# Patient Record
Sex: Male | Born: 1940
Health system: Southern US, Community
[De-identification: ages and names within clinical notes are randomized; demographics above are authoritative.]

## PROBLEM LIST (undated history)

## (undated) DIAGNOSIS — E119 Type 2 diabetes mellitus without complications: Secondary | ICD-10-CM

## (undated) DIAGNOSIS — I10 Essential (primary) hypertension: Secondary | ICD-10-CM

## (undated) DIAGNOSIS — E7800 Pure hypercholesterolemia, unspecified: Secondary | ICD-10-CM

## (undated) DIAGNOSIS — C61 Malignant neoplasm of prostate: Secondary | ICD-10-CM

## (undated) HISTORY — PX: URINARY SPHINCTER IMPLANT: SHX2624

## (undated) HISTORY — PX: HERNIA REPAIR: SHX51

## (undated) HISTORY — PX: PROSTATECTOMY: SHX69

## (undated) HISTORY — PX: CHOLECYSTECTOMY: SHX55

---

## 2004-01-02 ENCOUNTER — Other Ambulatory Visit: Payer: Self-pay

## 2004-01-08 ENCOUNTER — Inpatient Hospital Stay: Payer: Self-pay | Admitting: Urology

## 2004-03-01 DIAGNOSIS — C61 Malignant neoplasm of prostate: Secondary | ICD-10-CM

## 2004-03-01 HISTORY — DX: Malignant neoplasm of prostate: C61

## 2004-04-21 ENCOUNTER — Ambulatory Visit: Payer: Self-pay | Admitting: Urology

## 2005-04-05 ENCOUNTER — Ambulatory Visit: Payer: Self-pay | Admitting: Ophthalmology

## 2007-09-20 ENCOUNTER — Ambulatory Visit: Payer: Self-pay | Admitting: Unknown Physician Specialty

## 2012-03-10 ENCOUNTER — Ambulatory Visit: Payer: Self-pay | Admitting: Unknown Physician Specialty

## 2014-02-05 DIAGNOSIS — Z8546 Personal history of malignant neoplasm of prostate: Secondary | ICD-10-CM | POA: Insufficient documentation

## 2014-12-02 ENCOUNTER — Other Ambulatory Visit: Payer: Self-pay | Admitting: Internal Medicine

## 2014-12-02 DIAGNOSIS — R9389 Abnormal findings on diagnostic imaging of other specified body structures: Secondary | ICD-10-CM

## 2014-12-04 ENCOUNTER — Ambulatory Visit
Admission: RE | Admit: 2014-12-04 | Discharge: 2014-12-04 | Disposition: A | Discharge status: Home / Self Care | Payer: Commercial Managed Care - HMO | Source: Ambulatory Visit | Attending: Internal Medicine | Admitting: Internal Medicine

## 2014-12-04 ENCOUNTER — Encounter: Payer: Self-pay | Admitting: Radiology

## 2014-12-04 DIAGNOSIS — R938 Abnormal findings on diagnostic imaging of other specified body structures: Secondary | ICD-10-CM | POA: Diagnosis present

## 2014-12-04 DIAGNOSIS — R9389 Abnormal findings on diagnostic imaging of other specified body structures: Secondary | ICD-10-CM

## 2014-12-04 DIAGNOSIS — J189 Pneumonia, unspecified organism: Secondary | ICD-10-CM | POA: Diagnosis not present

## 2014-12-04 DIAGNOSIS — I2510 Atherosclerotic heart disease of native coronary artery without angina pectoris: Secondary | ICD-10-CM | POA: Insufficient documentation

## 2014-12-04 HISTORY — DX: Type 2 diabetes mellitus without complications: E11.9

## 2014-12-04 HISTORY — DX: Pure hypercholesterolemia, unspecified: E78.00

## 2014-12-04 HISTORY — DX: Malignant neoplasm of prostate: C61

## 2014-12-04 MED ORDER — IOHEXOL 300 MG/ML  SOLN
60.0000 mL | Freq: Once | INTRAMUSCULAR | Status: AC | PRN
  Administered 2014-12-04: 60 mL via INTRAVENOUS

## 2015-04-16 DIAGNOSIS — E7800 Pure hypercholesterolemia, unspecified: Secondary | ICD-10-CM | POA: Diagnosis not present

## 2015-04-16 DIAGNOSIS — E119 Type 2 diabetes mellitus without complications: Secondary | ICD-10-CM | POA: Diagnosis not present

## 2015-04-16 DIAGNOSIS — Z0000 Encounter for general adult medical examination without abnormal findings: Secondary | ICD-10-CM | POA: Diagnosis not present

## 2015-04-22 DIAGNOSIS — J189 Pneumonia, unspecified organism: Secondary | ICD-10-CM | POA: Diagnosis not present

## 2015-04-22 DIAGNOSIS — R05 Cough: Secondary | ICD-10-CM | POA: Diagnosis not present

## 2015-04-22 DIAGNOSIS — E119 Type 2 diabetes mellitus without complications: Secondary | ICD-10-CM | POA: Diagnosis not present

## 2015-04-22 DIAGNOSIS — E7800 Pure hypercholesterolemia, unspecified: Secondary | ICD-10-CM | POA: Diagnosis not present

## 2015-10-15 DIAGNOSIS — E119 Type 2 diabetes mellitus without complications: Secondary | ICD-10-CM | POA: Diagnosis not present

## 2015-10-15 DIAGNOSIS — Z125 Encounter for screening for malignant neoplasm of prostate: Secondary | ICD-10-CM | POA: Diagnosis not present

## 2015-10-15 DIAGNOSIS — E7800 Pure hypercholesterolemia, unspecified: Secondary | ICD-10-CM | POA: Diagnosis not present

## 2015-10-22 DIAGNOSIS — Z0000 Encounter for general adult medical examination without abnormal findings: Secondary | ICD-10-CM | POA: Diagnosis not present

## 2015-10-22 DIAGNOSIS — E1121 Type 2 diabetes mellitus with diabetic nephropathy: Secondary | ICD-10-CM | POA: Diagnosis not present

## 2016-01-29 DIAGNOSIS — H353131 Nonexudative age-related macular degeneration, bilateral, early dry stage: Secondary | ICD-10-CM | POA: Diagnosis not present

## 2016-02-03 ENCOUNTER — Other Ambulatory Visit: Payer: Self-pay | Admitting: Internal Medicine

## 2016-02-03 DIAGNOSIS — R0602 Shortness of breath: Secondary | ICD-10-CM

## 2016-02-03 DIAGNOSIS — R079 Chest pain, unspecified: Secondary | ICD-10-CM | POA: Diagnosis not present

## 2016-02-04 ENCOUNTER — Ambulatory Visit
Admission: RE | Admit: 2016-02-04 | Discharge: 2016-02-04 | Disposition: A | Discharge status: Home / Self Care | Payer: PPO | Source: Ambulatory Visit | Attending: Internal Medicine | Admitting: Internal Medicine

## 2016-02-04 DIAGNOSIS — I2510 Atherosclerotic heart disease of native coronary artery without angina pectoris: Secondary | ICD-10-CM | POA: Insufficient documentation

## 2016-02-04 DIAGNOSIS — R0602 Shortness of breath: Secondary | ICD-10-CM | POA: Diagnosis not present

## 2016-02-04 DIAGNOSIS — R079 Chest pain, unspecified: Secondary | ICD-10-CM | POA: Diagnosis not present

## 2016-02-04 DIAGNOSIS — J90 Pleural effusion, not elsewhere classified: Secondary | ICD-10-CM | POA: Diagnosis not present

## 2016-02-04 DIAGNOSIS — I700 Atherosclerosis of aorta: Secondary | ICD-10-CM | POA: Diagnosis not present

## 2016-02-04 HISTORY — DX: Essential (primary) hypertension: I10

## 2016-02-04 LAB — POCT I-STAT CREATININE: CREATININE: 0.9 mg/dL (ref 0.61–1.24)

## 2016-02-04 MED ORDER — IOPAMIDOL (ISOVUE-370) INJECTION 76%
75.0000 mL | Freq: Once | INTRAVENOUS | Status: AC | PRN
  Administered 2016-02-04: 75 mL via INTRAVENOUS

## 2016-02-05 DIAGNOSIS — I5021 Acute systolic (congestive) heart failure: Secondary | ICD-10-CM | POA: Diagnosis not present

## 2016-02-09 ENCOUNTER — Other Ambulatory Visit: Payer: Self-pay | Admitting: Cardiology

## 2016-02-09 DIAGNOSIS — I420 Dilated cardiomyopathy: Secondary | ICD-10-CM | POA: Diagnosis not present

## 2016-02-09 DIAGNOSIS — E7800 Pure hypercholesterolemia, unspecified: Secondary | ICD-10-CM | POA: Diagnosis not present

## 2016-02-09 DIAGNOSIS — I429 Cardiomyopathy, unspecified: Secondary | ICD-10-CM | POA: Diagnosis not present

## 2016-02-09 DIAGNOSIS — E119 Type 2 diabetes mellitus without complications: Secondary | ICD-10-CM | POA: Diagnosis not present

## 2016-02-09 DIAGNOSIS — I509 Heart failure, unspecified: Secondary | ICD-10-CM | POA: Diagnosis not present

## 2016-02-09 MED ORDER — SODIUM CHLORIDE 0.9 % IV SOLN: 250.0000 mL | INTRAVENOUS | Status: AC | PRN

## 2016-02-09 MED ORDER — SODIUM CHLORIDE 0.9% FLUSH: 3.0000 mL | INTRAVENOUS | Status: AC | PRN

## 2016-02-09 MED ORDER — SODIUM CHLORIDE 0.9% FLUSH: 3.0000 mL | Freq: Two times a day (BID) | INTRAVENOUS | Status: AC

## 2016-02-09 MED ORDER — ASPIRIN 81 MG PO CHEW: 81.0000 mg | CHEWABLE_TABLET | ORAL | Status: AC

## 2016-02-09 MED ORDER — SODIUM CHLORIDE 0.9 % IV SOLN: INTRAVENOUS | Status: AC

## 2016-02-10 DIAGNOSIS — I420 Dilated cardiomyopathy: Secondary | ICD-10-CM | POA: Insufficient documentation

## 2016-02-13 ENCOUNTER — Ambulatory Visit
Admission: RE | Admit: 2016-02-13 | Discharge: 2016-02-13 | Disposition: A | Discharge status: Home / Self Care | Payer: PPO | Source: Ambulatory Visit | Attending: Cardiology | Admitting: Cardiology

## 2016-02-13 ENCOUNTER — Encounter
Admission: RE | Disposition: A | Discharge status: Home / Self Care | Payer: Self-pay | Source: Ambulatory Visit | Attending: Cardiology

## 2016-02-13 ENCOUNTER — Encounter: Payer: Self-pay | Admitting: *Deleted

## 2016-02-13 DIAGNOSIS — Z79899 Other long term (current) drug therapy: Secondary | ICD-10-CM | POA: Diagnosis not present

## 2016-02-13 DIAGNOSIS — Z8546 Personal history of malignant neoplasm of prostate: Secondary | ICD-10-CM | POA: Diagnosis not present

## 2016-02-13 DIAGNOSIS — I358 Other nonrheumatic aortic valve disorders: Secondary | ICD-10-CM | POA: Insufficient documentation

## 2016-02-13 DIAGNOSIS — Z7982 Long term (current) use of aspirin: Secondary | ICD-10-CM | POA: Diagnosis not present

## 2016-02-13 DIAGNOSIS — I509 Heart failure, unspecified: Secondary | ICD-10-CM | POA: Insufficient documentation

## 2016-02-13 DIAGNOSIS — I429 Cardiomyopathy, unspecified: Secondary | ICD-10-CM | POA: Diagnosis not present

## 2016-02-13 DIAGNOSIS — Z7984 Long term (current) use of oral hypoglycemic drugs: Secondary | ICD-10-CM | POA: Diagnosis not present

## 2016-02-13 DIAGNOSIS — E119 Type 2 diabetes mellitus without complications: Secondary | ICD-10-CM | POA: Insufficient documentation

## 2016-02-13 DIAGNOSIS — E7800 Pure hypercholesterolemia, unspecified: Secondary | ICD-10-CM | POA: Insufficient documentation

## 2016-02-13 HISTORY — PX: CARDIAC CATHETERIZATION: SHX172

## 2016-02-13 SURGERY — RIGHT/LEFT HEART CATH AND CORONARY ANGIOGRAPHY
Anesthesia: Moderate Sedation | Laterality: Bilateral

## 2016-02-13 MED ORDER — SODIUM CHLORIDE 0.9 % IV SOLN: 250.0000 mL | INTRAVENOUS | Status: DC | PRN

## 2016-02-13 MED ORDER — IOPAMIDOL (ISOVUE-300) INJECTION 61%
INTRAVENOUS | Status: DC | PRN
  Administered 2016-02-13: 85 mL via INTRA_ARTERIAL

## 2016-02-13 MED ORDER — HEPARIN (PORCINE) IN NACL 2-0.9 UNIT/ML-% IJ SOLN
INTRAMUSCULAR | Status: AC
  Filled 2016-02-13: qty 500

## 2016-02-13 MED ORDER — SODIUM CHLORIDE 0.9 % IV SOLN: INTRAVENOUS | Status: DC

## 2016-02-13 MED ORDER — SODIUM CHLORIDE 0.9% FLUSH: 3.0000 mL | Freq: Two times a day (BID) | INTRAVENOUS | Status: DC

## 2016-02-13 MED ORDER — SODIUM CHLORIDE 0.9% FLUSH: 3.0000 mL | INTRAVENOUS | Status: DC | PRN

## 2016-02-13 MED ORDER — MIDAZOLAM HCL 2 MG/2ML IJ SOLN
INTRAMUSCULAR | Status: AC
  Filled 2016-02-13: qty 2

## 2016-02-13 MED ORDER — FENTANYL CITRATE (PF) 100 MCG/2ML IJ SOLN
INTRAMUSCULAR | Status: AC
  Filled 2016-02-13: qty 2

## 2016-02-13 MED ORDER — ASPIRIN 81 MG PO CHEW: 81.0000 mg | CHEWABLE_TABLET | ORAL | Status: DC

## 2016-02-13 MED ORDER — MIDAZOLAM HCL 2 MG/2ML IJ SOLN
INTRAMUSCULAR | Status: DC | PRN
  Administered 2016-02-13 (×2): 0.5 mg

## 2016-02-13 MED ORDER — FENTANYL CITRATE (PF) 100 MCG/2ML IJ SOLN
INTRAMUSCULAR | Status: DC | PRN
  Administered 2016-02-13 (×2): 25 ug

## 2016-02-13 SURGICAL SUPPLY — 16 items
CATH INFINITI 5 FR JL3.5 (CATHETERS) ×3 IMPLANT
CATH INFINITI 5FR ANG PIGTAIL (CATHETERS) ×3 IMPLANT
CATH INFINITI 5FR JL4 (CATHETERS) ×3 IMPLANT
CATH INFINITI JR4 5F (CATHETERS) ×3 IMPLANT
CATH SWANZ 7F THERMO (CATHETERS) ×3 IMPLANT
DEVICE CLOSURE MYNXGRIP 6/7F (Vascular Products) ×3 IMPLANT
GUIDEWIRE EMER 3M J .025X150CM (WIRE) ×3 IMPLANT
KIT MANI 3VAL PERCEP (MISCELLANEOUS) ×3 IMPLANT
KIT RIGHT HEART (MISCELLANEOUS) ×3 IMPLANT
NEEDLE PERC 18GX7CM (NEEDLE) ×3 IMPLANT
PACK CARDIAC CATH (CUSTOM PROCEDURE TRAY) ×3 IMPLANT
SHEATH AVANTI 5FR X 11CM (SHEATH) ×3 IMPLANT
SHEATH AVANTI 6FR X 11CM (SHEATH) ×3 IMPLANT
SHEATH PINNACLE 7F 10CM (SHEATH) ×3 IMPLANT
WIRE EMERALD 3MM-J .035X150CM (WIRE) ×3 IMPLANT
WIRE EMERALD ST .035X150CM (WIRE) ×3 IMPLANT

## 2016-02-13 NOTE — H&P (Signed)
Chief Complaint: Chief Complaint  Patient presents with  . per miller  sob and cough----8-9 weeks progressively getting worse--had CT showed fluid on lungs so had echo which sent him here  Date of Service: 02/09/2016 Date of Birth: April 14, 1940 PCP: Rusty Aus, MD  History of Present Illness: William Snyder is a 75 y.o.male patient who is urgently referred by Dr. Rusty Aus for evaluation after echocardiogram was read as showing cardiomyopathy with an ejection fraction of 15%. Patient has a history of approximately 2-3 months of progressive dyspnea. Underwent a chest CT which revealed evidence of probable pulmonary edema with no pulmonary emboli. There is a mild interstitial edema pattern. Patient underwent an echocardiogram which was read as showing ejection fraction of less than 15% with global hypokinesis. There is a sclerotic aortic valve. Patient is now referred for further evaluation. He complains of dyspnea on exertion. He denies chest discomfort. He denies a viral illness. He denies ethanol abuse. He does not smoke. He does have diabetes mellitus. He was given furosemide last week. He takes 20 mg daily. He admits to a high sodium diet. Was on lisinopril but had a dry cough. He is currently on losartan at 50 mg daily. He treated with simvastatin for hyperlipidemia. He is on metformin for diabetes mellitus.  Past Medical and Surgical History  Past Medical History Past Medical History:  Diagnosis Date  . Prostate cancer (CMS-HCC)  . Pure hypercholesterolemia  . Type II or unspecified type diabetes mellitus without mention of complication, not stated as uncontrolled (CMS-HCC)   Past Surgical History He has a past surgical history that includes Cholecystectomy; Sphincter implant (2007); Hernia repair; Vasectomy; Prostate surgery; Surgery for urethral stricture; Cataract extraction (Bilateral); egd (07/27/1996); Colonoscopy (04/13/1995); Colonoscopy (09/20/2007); and Colonoscopy (03/10/2012).    Medications and Allergies  Current Medications  Current Outpatient Prescriptions  Medication Sig Dispense Refill  . alcohol swabs (BD ALCOHOL SWABS) PadM Apply 1 Swab topically 2 (two) times daily. 180 each 3  . aspirin 81 MG EC tablet Take 81 mg by mouth once daily.  . blood glucose control high,low (ACCU-CHEK AVIVA CONTROL SOLN) Soln Use 1 Bottle as directed. 1 each 3  . blood glucose diagnostic test strip Use 2 (two) times daily. One touch ultra test strips. Diagnosis: E11.9 100 each 11  . blood glucose meter kit Use as directed. One touch glucometer. Diagnosis: E11.9 1 each 0  . FUROsemide (LASIX) 20 MG tablet Take 1 tablet (20 mg total) by mouth once daily. 30 tablet 1  . glimepiride (AMARYL) 4 MG tablet Take 1 tablet (4 mg total) by mouth daily with breakfast. 90 tablet 3  . lancets Use 1 each 2 (two) times daily. One touch lancets. Diagnosis: E11.9 100 each 12  . losartan (COZAAR) 50 MG tablet Take 1 tablet (50 mg total) by mouth once daily. 90 tablet 3  . metFORMIN (GLUCOPHAGE) 1000 MG tablet Take 2.5 tablets (2,500 mg total) by mouth once daily. 1 q.a.m. 1-1/2 q.p.m. 225 tablet 3  . potassium chloride (KLOR-CON) 10 MEQ ER tablet Take 1 tablet (10 mEq total) by mouth once daily. 30 tablet 1  . simvastatin (ZOCOR) 40 MG tablet Take 1 tablet (40 mg total) by mouth nightly. 90 tablet 3   No current facility-administered medications for this visit.   Allergies: Patient has no known allergies.  Social and Family History  Social History reports that he has never smoked. He has never used smokeless tobacco. He reports that he does not  drink alcohol.  Family History Family History  Problem Relation Age of Onset  . Heart failure Mother  . Stroke Mother  . Stroke Father  . Myocardial Infarction (Heart attack) Father  . Myocardial Infarction (Heart attack) Brother   Review of Systems  Review of Systems  Constitutional: Positive for malaise/fatigue and weight loss. Negative for  chills, diaphoresis and fever.  HENT: Negative. Negative for congestion, ear discharge, hearing loss and tinnitus.  Eyes: Negative. Negative for blurred vision.  Respiratory: Positive for cough and shortness of breath. Negative for hemoptysis, sputum production and wheezing.  Cardiovascular: Negative. Negative for chest pain, palpitations, orthopnea, claudication, leg swelling and PND.  Gastrointestinal: Negative. Negative for abdominal pain, blood in stool, constipation, diarrhea, heartburn, melena, nausea and vomiting.  Genitourinary: Negative. Negative for dysuria, frequency, hematuria and urgency.  Musculoskeletal: Negative. Negative for back pain, falls, joint pain and myalgias.  Skin: Negative for itching and rash.  Neurological: Positive for weakness. Negative for dizziness, tingling, focal weakness, loss of consciousness and headaches.  Endo/Heme/Allergies: Negative. Negative for polydipsia. Does not bruise/bleed easily.  Psychiatric/Behavioral: Negative. Negative for depression, memory loss and substance abuse. The patient is not nervous/anxious.   Physical Examination   Vitals:BP 120/84  Pulse 88  Resp 12  Ht 177.8 cm (5' 10" )  Wt 77.1 kg (170 lb)  BMI 24.39 kg/m  Ht:177.8 cm (5' 10" ) Wt:77.1 kg (170 lb) FBX:UXYB surface area is 1.95 meters squared. Body mass index is 24.39 kg/m.  Wt Readings from Last 3 Encounters:  02/09/16 77.1 kg (170 lb)  02/03/16 80.3 kg (177 lb)  10/22/15 80.7 kg (178 lb)   BP Readings from Last 3 Encounters:  02/09/16 120/84  02/03/16 104/60  10/22/15 142/70   General appearance appears in no acute distress  Head Mouth and Eye exam Normocephalic, without obvious abnormality, atraumatic Dentition is good Eyes appear anicteric   Neck exam Thyroid: normal  Nodes: no obvious adenopathy  LUNGS Breath Sounds: Normal Percussion: Normal  CARDIOVASCULAR JVP CV wave: no HJR: no Elevation at 90 degrees: None Carotid Pulse: normal  pulsation bilaterally Bruit: None Apex: apical impulse normal  Auscultation Rhythm: normal sinus rhythm S1: normal S2: normal Clicks: no Rub: no Murmurs: no murmurs  Gallop: None ABDOMEN Liver enlargement: no Pulsatile aorta: no Ascites: no Bruits: no  EXTREMITIES Clubbing: no Edema: 1+ bilateral pedal edema Pulses: peripheral pulses symmetrical Femoral Bruits: no Amputation: no SKIN Rash: no Cyanosis: no Embolic phemonenon: no Bruising: no NEURO Alert and Oriented to person, place and time: yes Non focal: yes  PSYCH: Pt appears to have normal affect  LABS REVIEWED Last 3 CBC results: Lab Results  Component Value Date  WBC 11.6 (H) 02/09/2016  WBC 8.1 10/15/2015  WBC 8.1 10/11/2014   Lab Results  Component Value Date  HGB 14.9 02/09/2016  HGB 14.0 (L) 10/15/2015  HGB 14.0 (L) 04/16/2015   Lab Results  Component Value Date  HCT 43.9 02/09/2016  HCT 42.2 10/15/2015  HCT 43.9 10/11/2014   Lab Results  Component Value Date  PLT 296 02/09/2016  PLT 240 10/15/2015  PLT 238 10/11/2014   Lab Results  Component Value Date  CREATININE 1.3 02/09/2016  BUN 23 02/09/2016  NA 134 (L) 02/09/2016  K 5.0 02/09/2016  CL 96 (L) 02/09/2016  CO2 28.0 02/09/2016   Lab Results  Component Value Date  HGBA1C 9.7 (H) 10/15/2015   Lab Results  Component Value Date  HDL 52.7 10/15/2015  HDL 46.2 04/16/2015  HDL 46.0 10/11/2014  Lab Results  Component Value Date  LDLCALC 155 (H) 10/15/2015  LDLCALC 77 04/16/2015  LDLCALC 74 10/11/2014   Lab Results  Component Value Date  TRIG 123 10/15/2015  TRIG 136 04/16/2015  TRIG 115 10/11/2014   Lab Results  Component Value Date  ALT 14 10/15/2015  AST 16 10/15/2015  ALKPHOS 60 10/15/2015   Lab Results  Component Value Date  TSH 4.180 10/15/2015   Diagnostic Studies Reviewed:  EKG EKG demonstrated normal sinus rhythm, nonspecific ST and T waves changes.  Assessment and Plan   75 y.o. male with   ICD-10-CM ICD-9-CM  1. CHF with cardiomyopathy (CMS-HCC)-etiology of cardiomyopathy is unclear. Will need to evaluate for evidence of ischemic etiology. We will proceed with a right left heart cath to evaluate pulmonary pressures as well as her evidence of coronary etiology of his symptoms to guide further therapy. Will continue with afterload reduction, diuretic. Defer beta-blocker at present due to relative hypotension however will need to consider adding carvedilol at 3.125 mg twice daily. Further recommendations after cardiac catheterization. Low-sodium diet is strongly recommended. I50.9 425.5 Basic Metabolic Panel (BMP)  K58.9 425.4 CBC w/auto Differential (5 Part)  Basic Metabolic Panel (BMP)  CBC w/auto Differential (5 Part)  2. Pure hypercholesterolemia continue with simvastatin with an LDL goal of less than 100. Current value is 155. Low-fat diet is strongly recommended E78.00 272.0  3. Cardiomyopathy, dilated (CMS-HCC) if and as per above I42.0 425.4  4. Controlled type 2 diabetes mellitus and continue with metformin. A1c goal of less than 6 E11.9 250.00   Return in about 2 weeks (around 02/23/2016).  These notes generated with voice recognition software. I apologize for typographical errors.  Sydnee Levans, MD    H and P reviewed. No changes.

## 2016-02-16 ENCOUNTER — Other Ambulatory Visit: Payer: Self-pay | Admitting: Cardiology

## 2016-02-17 DIAGNOSIS — I420 Dilated cardiomyopathy: Secondary | ICD-10-CM | POA: Diagnosis not present

## 2016-02-17 DIAGNOSIS — E119 Type 2 diabetes mellitus without complications: Secondary | ICD-10-CM | POA: Diagnosis not present

## 2016-02-18 DIAGNOSIS — I429 Cardiomyopathy, unspecified: Secondary | ICD-10-CM | POA: Diagnosis not present

## 2016-02-19 ENCOUNTER — Telehealth (HOSPITAL_COMMUNITY): Payer: Self-pay | Admitting: Internal Medicine

## 2016-02-19 DIAGNOSIS — I420 Dilated cardiomyopathy: Secondary | ICD-10-CM | POA: Diagnosis not present

## 2016-02-19 DIAGNOSIS — I509 Heart failure, unspecified: Secondary | ICD-10-CM | POA: Diagnosis not present

## 2016-02-19 NOTE — Telephone Encounter (Signed)
Dr. Ubaldo Glassing sent Referral for this pt to see Dr. Haroldine Laws.  Called to get pt scheduled & pt's wife, Baker Janus, stated pt was seen 02/18/16 @[facility] by Dr. Leontine Locket for Adv Heart Failure.  Declined appt here.

## 2016-03-03 DIAGNOSIS — E119 Type 2 diabetes mellitus without complications: Secondary | ICD-10-CM | POA: Diagnosis not present

## 2016-03-03 DIAGNOSIS — I420 Dilated cardiomyopathy: Secondary | ICD-10-CM | POA: Diagnosis not present

## 2016-03-21 DIAGNOSIS — I509 Heart failure, unspecified: Secondary | ICD-10-CM | POA: Diagnosis not present

## 2016-03-21 DIAGNOSIS — I420 Dilated cardiomyopathy: Secondary | ICD-10-CM | POA: Diagnosis not present

## 2016-03-24 DIAGNOSIS — I429 Cardiomyopathy, unspecified: Secondary | ICD-10-CM | POA: Diagnosis not present

## 2016-04-08 DIAGNOSIS — I5020 Unspecified systolic (congestive) heart failure: Secondary | ICD-10-CM | POA: Diagnosis not present

## 2016-04-08 DIAGNOSIS — R9439 Abnormal result of other cardiovascular function study: Secondary | ICD-10-CM | POA: Diagnosis not present

## 2016-04-08 DIAGNOSIS — I429 Cardiomyopathy, unspecified: Secondary | ICD-10-CM | POA: Diagnosis not present

## 2016-04-16 DIAGNOSIS — E119 Type 2 diabetes mellitus without complications: Secondary | ICD-10-CM | POA: Diagnosis not present

## 2016-04-16 DIAGNOSIS — E7800 Pure hypercholesterolemia, unspecified: Secondary | ICD-10-CM | POA: Diagnosis not present

## 2016-04-16 DIAGNOSIS — Z0000 Encounter for general adult medical examination without abnormal findings: Secondary | ICD-10-CM | POA: Diagnosis not present

## 2016-04-21 DIAGNOSIS — Z7409 Other reduced mobility: Secondary | ICD-10-CM | POA: Diagnosis not present

## 2016-04-21 DIAGNOSIS — E785 Hyperlipidemia, unspecified: Secondary | ICD-10-CM | POA: Diagnosis not present

## 2016-04-21 DIAGNOSIS — Z8701 Personal history of pneumonia (recurrent): Secondary | ICD-10-CM | POA: Diagnosis not present

## 2016-04-21 DIAGNOSIS — R5381 Other malaise: Secondary | ICD-10-CM | POA: Diagnosis not present

## 2016-04-21 DIAGNOSIS — Z6822 Body mass index (BMI) 22.0-22.9, adult: Secondary | ICD-10-CM | POA: Diagnosis not present

## 2016-04-21 DIAGNOSIS — I420 Dilated cardiomyopathy: Secondary | ICD-10-CM | POA: Diagnosis not present

## 2016-04-21 DIAGNOSIS — Z79899 Other long term (current) drug therapy: Secondary | ICD-10-CM | POA: Diagnosis not present

## 2016-04-21 DIAGNOSIS — Z7984 Long term (current) use of oral hypoglycemic drugs: Secondary | ICD-10-CM | POA: Diagnosis not present

## 2016-04-21 DIAGNOSIS — Z9049 Acquired absence of other specified parts of digestive tract: Secondary | ICD-10-CM | POA: Diagnosis not present

## 2016-04-21 DIAGNOSIS — M6259 Muscle wasting and atrophy, not elsewhere classified, multiple sites: Secondary | ICD-10-CM | POA: Diagnosis not present

## 2016-04-21 DIAGNOSIS — Z9689 Presence of other specified functional implants: Secondary | ICD-10-CM | POA: Diagnosis not present

## 2016-04-21 DIAGNOSIS — Z8249 Family history of ischemic heart disease and other diseases of the circulatory system: Secondary | ICD-10-CM | POA: Diagnosis not present

## 2016-04-21 DIAGNOSIS — I2510 Atherosclerotic heart disease of native coronary artery without angina pectoris: Secondary | ICD-10-CM | POA: Diagnosis not present

## 2016-04-21 DIAGNOSIS — E1122 Type 2 diabetes mellitus with diabetic chronic kidney disease: Secondary | ICD-10-CM | POA: Diagnosis not present

## 2016-04-21 DIAGNOSIS — I509 Heart failure, unspecified: Secondary | ICD-10-CM | POA: Diagnosis not present

## 2016-04-21 DIAGNOSIS — N182 Chronic kidney disease, stage 2 (mild): Secondary | ICD-10-CM | POA: Diagnosis not present

## 2016-04-21 DIAGNOSIS — I5022 Chronic systolic (congestive) heart failure: Secondary | ICD-10-CM | POA: Diagnosis not present

## 2016-04-21 DIAGNOSIS — I083 Combined rheumatic disorders of mitral, aortic and tricuspid valves: Secondary | ICD-10-CM | POA: Diagnosis not present

## 2016-04-21 DIAGNOSIS — Z8546 Personal history of malignant neoplasm of prostate: Secondary | ICD-10-CM | POA: Diagnosis not present

## 2016-04-21 DIAGNOSIS — E440 Moderate protein-calorie malnutrition: Secondary | ICD-10-CM | POA: Diagnosis not present

## 2016-04-21 DIAGNOSIS — R110 Nausea: Secondary | ICD-10-CM | POA: Diagnosis not present

## 2016-04-21 DIAGNOSIS — Z7982 Long term (current) use of aspirin: Secondary | ICD-10-CM | POA: Diagnosis not present

## 2016-04-21 DIAGNOSIS — I130 Hypertensive heart and chronic kidney disease with heart failure and stage 1 through stage 4 chronic kidney disease, or unspecified chronic kidney disease: Secondary | ICD-10-CM | POA: Diagnosis not present

## 2016-04-23 DIAGNOSIS — E119 Type 2 diabetes mellitus without complications: Secondary | ICD-10-CM | POA: Diagnosis not present

## 2016-04-23 DIAGNOSIS — Z0000 Encounter for general adult medical examination without abnormal findings: Secondary | ICD-10-CM | POA: Diagnosis not present

## 2016-04-23 DIAGNOSIS — I420 Dilated cardiomyopathy: Secondary | ICD-10-CM | POA: Diagnosis not present

## 2016-05-06 DIAGNOSIS — Q251 Coarctation of aorta: Secondary | ICD-10-CM | POA: Diagnosis not present

## 2016-05-06 DIAGNOSIS — I361 Nonrheumatic tricuspid (valve) insufficiency: Secondary | ICD-10-CM | POA: Diagnosis not present

## 2016-05-06 DIAGNOSIS — I340 Nonrheumatic mitral (valve) insufficiency: Secondary | ICD-10-CM | POA: Diagnosis not present

## 2016-05-06 DIAGNOSIS — I517 Cardiomegaly: Secondary | ICD-10-CM | POA: Diagnosis not present

## 2016-05-06 DIAGNOSIS — I083 Combined rheumatic disorders of mitral, aortic and tricuspid valves: Secondary | ICD-10-CM | POA: Diagnosis not present

## 2016-05-06 DIAGNOSIS — I700 Atherosclerosis of aorta: Secondary | ICD-10-CM | POA: Diagnosis not present

## 2016-05-19 DIAGNOSIS — I420 Dilated cardiomyopathy: Secondary | ICD-10-CM | POA: Diagnosis not present

## 2016-05-19 DIAGNOSIS — I509 Heart failure, unspecified: Secondary | ICD-10-CM | POA: Diagnosis not present

## 2016-05-21 NOTE — Telephone Encounter (Signed)
error 

## 2016-06-19 DIAGNOSIS — I420 Dilated cardiomyopathy: Secondary | ICD-10-CM | POA: Diagnosis not present

## 2016-06-19 DIAGNOSIS — I509 Heart failure, unspecified: Secondary | ICD-10-CM | POA: Diagnosis not present

## 2016-06-30 DIAGNOSIS — I420 Dilated cardiomyopathy: Secondary | ICD-10-CM | POA: Diagnosis not present

## 2016-06-30 DIAGNOSIS — I519 Heart disease, unspecified: Secondary | ICD-10-CM | POA: Diagnosis not present

## 2016-06-30 DIAGNOSIS — R001 Bradycardia, unspecified: Secondary | ICD-10-CM | POA: Diagnosis not present

## 2016-06-30 DIAGNOSIS — I5022 Chronic systolic (congestive) heart failure: Secondary | ICD-10-CM | POA: Diagnosis not present

## 2016-07-07 DIAGNOSIS — I5022 Chronic systolic (congestive) heart failure: Secondary | ICD-10-CM | POA: Diagnosis not present

## 2016-07-07 DIAGNOSIS — I509 Heart failure, unspecified: Secondary | ICD-10-CM | POA: Insufficient documentation

## 2016-07-16 DIAGNOSIS — E119 Type 2 diabetes mellitus without complications: Secondary | ICD-10-CM | POA: Diagnosis not present

## 2016-07-19 DIAGNOSIS — I509 Heart failure, unspecified: Secondary | ICD-10-CM | POA: Diagnosis not present

## 2016-07-19 DIAGNOSIS — I420 Dilated cardiomyopathy: Secondary | ICD-10-CM | POA: Diagnosis not present

## 2016-07-22 DIAGNOSIS — E1121 Type 2 diabetes mellitus with diabetic nephropathy: Secondary | ICD-10-CM | POA: Diagnosis not present

## 2016-07-22 DIAGNOSIS — Z125 Encounter for screening for malignant neoplasm of prostate: Secondary | ICD-10-CM | POA: Diagnosis not present

## 2016-07-22 DIAGNOSIS — Z79899 Other long term (current) drug therapy: Secondary | ICD-10-CM | POA: Diagnosis not present

## 2016-07-22 DIAGNOSIS — I420 Dilated cardiomyopathy: Secondary | ICD-10-CM | POA: Diagnosis not present

## 2016-07-22 DIAGNOSIS — E119 Type 2 diabetes mellitus without complications: Secondary | ICD-10-CM | POA: Diagnosis not present

## 2016-07-30 DIAGNOSIS — I429 Cardiomyopathy, unspecified: Secondary | ICD-10-CM | POA: Diagnosis not present

## 2016-07-30 DIAGNOSIS — I509 Heart failure, unspecified: Secondary | ICD-10-CM | POA: Diagnosis not present

## 2016-07-30 DIAGNOSIS — I130 Hypertensive heart and chronic kidney disease with heart failure and stage 1 through stage 4 chronic kidney disease, or unspecified chronic kidney disease: Secondary | ICD-10-CM | POA: Diagnosis not present

## 2016-07-30 DIAGNOSIS — Z7984 Long term (current) use of oral hypoglycemic drugs: Secondary | ICD-10-CM | POA: Diagnosis not present

## 2016-07-30 DIAGNOSIS — I493 Ventricular premature depolarization: Secondary | ICD-10-CM | POA: Diagnosis not present

## 2016-07-30 DIAGNOSIS — Z8546 Personal history of malignant neoplasm of prostate: Secondary | ICD-10-CM | POA: Diagnosis not present

## 2016-07-30 DIAGNOSIS — N182 Chronic kidney disease, stage 2 (mild): Secondary | ICD-10-CM | POA: Diagnosis not present

## 2016-07-30 DIAGNOSIS — E785 Hyperlipidemia, unspecified: Secondary | ICD-10-CM | POA: Diagnosis not present

## 2016-07-30 DIAGNOSIS — Z950 Presence of cardiac pacemaker: Secondary | ICD-10-CM | POA: Diagnosis not present

## 2016-07-30 DIAGNOSIS — Z7982 Long term (current) use of aspirin: Secondary | ICD-10-CM | POA: Diagnosis not present

## 2016-07-30 DIAGNOSIS — E1122 Type 2 diabetes mellitus with diabetic chronic kidney disease: Secondary | ICD-10-CM | POA: Diagnosis not present

## 2016-07-30 DIAGNOSIS — Z79899 Other long term (current) drug therapy: Secondary | ICD-10-CM | POA: Diagnosis not present

## 2016-07-30 DIAGNOSIS — Z9049 Acquired absence of other specified parts of digestive tract: Secondary | ICD-10-CM | POA: Diagnosis not present

## 2016-07-30 DIAGNOSIS — Z95810 Presence of automatic (implantable) cardiac defibrillator: Secondary | ICD-10-CM | POA: Diagnosis not present

## 2016-07-30 DIAGNOSIS — E860 Dehydration: Secondary | ICD-10-CM | POA: Diagnosis not present

## 2016-07-30 DIAGNOSIS — I5022 Chronic systolic (congestive) heart failure: Secondary | ICD-10-CM | POA: Diagnosis not present

## 2016-07-30 DIAGNOSIS — I2510 Atherosclerotic heart disease of native coronary artery without angina pectoris: Secondary | ICD-10-CM | POA: Diagnosis not present

## 2016-08-04 DIAGNOSIS — Z95810 Presence of automatic (implantable) cardiac defibrillator: Secondary | ICD-10-CM | POA: Diagnosis not present

## 2016-08-04 DIAGNOSIS — Z4502 Encounter for adjustment and management of automatic implantable cardiac defibrillator: Secondary | ICD-10-CM | POA: Diagnosis not present

## 2016-08-04 DIAGNOSIS — Z8249 Family history of ischemic heart disease and other diseases of the circulatory system: Secondary | ICD-10-CM | POA: Diagnosis not present

## 2016-08-04 DIAGNOSIS — Z7984 Long term (current) use of oral hypoglycemic drugs: Secondary | ICD-10-CM | POA: Diagnosis not present

## 2016-08-04 DIAGNOSIS — E785 Hyperlipidemia, unspecified: Secondary | ICD-10-CM | POA: Diagnosis not present

## 2016-08-04 DIAGNOSIS — Z7982 Long term (current) use of aspirin: Secondary | ICD-10-CM | POA: Diagnosis not present

## 2016-08-04 DIAGNOSIS — Z79899 Other long term (current) drug therapy: Secondary | ICD-10-CM | POA: Diagnosis not present

## 2016-08-04 DIAGNOSIS — E1122 Type 2 diabetes mellitus with diabetic chronic kidney disease: Secondary | ICD-10-CM | POA: Diagnosis not present

## 2016-08-04 DIAGNOSIS — Z9049 Acquired absence of other specified parts of digestive tract: Secondary | ICD-10-CM | POA: Diagnosis not present

## 2016-08-04 DIAGNOSIS — E119 Type 2 diabetes mellitus without complications: Secondary | ICD-10-CM | POA: Diagnosis not present

## 2016-08-04 DIAGNOSIS — I5022 Chronic systolic (congestive) heart failure: Secondary | ICD-10-CM | POA: Diagnosis not present

## 2016-08-04 DIAGNOSIS — I2510 Atherosclerotic heart disease of native coronary artery without angina pectoris: Secondary | ICD-10-CM | POA: Diagnosis not present

## 2016-08-04 DIAGNOSIS — I130 Hypertensive heart and chronic kidney disease with heart failure and stage 1 through stage 4 chronic kidney disease, or unspecified chronic kidney disease: Secondary | ICD-10-CM | POA: Diagnosis not present

## 2016-08-04 DIAGNOSIS — E1165 Type 2 diabetes mellitus with hyperglycemia: Secondary | ICD-10-CM | POA: Diagnosis not present

## 2016-08-04 DIAGNOSIS — N182 Chronic kidney disease, stage 2 (mild): Secondary | ICD-10-CM | POA: Diagnosis not present

## 2016-08-04 DIAGNOSIS — I129 Hypertensive chronic kidney disease with stage 1 through stage 4 chronic kidney disease, or unspecified chronic kidney disease: Secondary | ICD-10-CM | POA: Diagnosis not present

## 2016-08-04 DIAGNOSIS — Z8546 Personal history of malignant neoplasm of prostate: Secondary | ICD-10-CM | POA: Diagnosis not present

## 2016-08-06 DIAGNOSIS — Z79899 Other long term (current) drug therapy: Secondary | ICD-10-CM | POA: Diagnosis not present

## 2016-08-06 DIAGNOSIS — Z7984 Long term (current) use of oral hypoglycemic drugs: Secondary | ICD-10-CM | POA: Diagnosis not present

## 2016-08-06 DIAGNOSIS — C61 Malignant neoplasm of prostate: Secondary | ICD-10-CM | POA: Diagnosis not present

## 2016-08-06 DIAGNOSIS — E1122 Type 2 diabetes mellitus with diabetic chronic kidney disease: Secondary | ICD-10-CM | POA: Diagnosis not present

## 2016-08-06 DIAGNOSIS — I5022 Chronic systolic (congestive) heart failure: Secondary | ICD-10-CM | POA: Diagnosis not present

## 2016-08-06 DIAGNOSIS — N182 Chronic kidney disease, stage 2 (mild): Secondary | ICD-10-CM | POA: Diagnosis not present

## 2016-08-06 DIAGNOSIS — E785 Hyperlipidemia, unspecified: Secondary | ICD-10-CM | POA: Diagnosis not present

## 2016-08-06 DIAGNOSIS — Z7982 Long term (current) use of aspirin: Secondary | ICD-10-CM | POA: Diagnosis not present

## 2016-08-06 DIAGNOSIS — I2510 Atherosclerotic heart disease of native coronary artery without angina pectoris: Secondary | ICD-10-CM | POA: Diagnosis not present

## 2016-08-06 DIAGNOSIS — I129 Hypertensive chronic kidney disease with stage 1 through stage 4 chronic kidney disease, or unspecified chronic kidney disease: Secondary | ICD-10-CM | POA: Diagnosis not present

## 2016-08-27 DIAGNOSIS — Z7982 Long term (current) use of aspirin: Secondary | ICD-10-CM | POA: Diagnosis not present

## 2016-08-27 DIAGNOSIS — I5022 Chronic systolic (congestive) heart failure: Secondary | ICD-10-CM | POA: Diagnosis not present

## 2016-08-27 DIAGNOSIS — E785 Hyperlipidemia, unspecified: Secondary | ICD-10-CM | POA: Diagnosis not present

## 2016-08-27 DIAGNOSIS — N182 Chronic kidney disease, stage 2 (mild): Secondary | ICD-10-CM | POA: Diagnosis not present

## 2016-08-27 DIAGNOSIS — I130 Hypertensive heart and chronic kidney disease with heart failure and stage 1 through stage 4 chronic kidney disease, or unspecified chronic kidney disease: Secondary | ICD-10-CM | POA: Diagnosis not present

## 2016-08-27 DIAGNOSIS — C61 Malignant neoplasm of prostate: Secondary | ICD-10-CM | POA: Diagnosis not present

## 2016-08-27 DIAGNOSIS — Z7984 Long term (current) use of oral hypoglycemic drugs: Secondary | ICD-10-CM | POA: Diagnosis not present

## 2016-08-27 DIAGNOSIS — I2510 Atherosclerotic heart disease of native coronary artery without angina pectoris: Secondary | ICD-10-CM | POA: Diagnosis not present

## 2016-08-27 DIAGNOSIS — E1122 Type 2 diabetes mellitus with diabetic chronic kidney disease: Secondary | ICD-10-CM | POA: Diagnosis not present

## 2016-09-02 DIAGNOSIS — I5022 Chronic systolic (congestive) heart failure: Secondary | ICD-10-CM | POA: Diagnosis not present

## 2016-09-30 DIAGNOSIS — I5022 Chronic systolic (congestive) heart failure: Secondary | ICD-10-CM | POA: Diagnosis not present

## 2016-10-15 DIAGNOSIS — I5022 Chronic systolic (congestive) heart failure: Secondary | ICD-10-CM | POA: Diagnosis not present

## 2016-10-15 DIAGNOSIS — I509 Heart failure, unspecified: Secondary | ICD-10-CM | POA: Diagnosis not present

## 2016-11-04 DIAGNOSIS — I5022 Chronic systolic (congestive) heart failure: Secondary | ICD-10-CM | POA: Diagnosis not present

## 2016-11-04 DIAGNOSIS — Z4502 Encounter for adjustment and management of automatic implantable cardiac defibrillator: Secondary | ICD-10-CM | POA: Diagnosis not present

## 2016-11-18 ENCOUNTER — Encounter: Payer: Self-pay | Admitting: *Deleted

## 2016-11-18 ENCOUNTER — Encounter: Payer: PPO | Attending: Internal Medicine | Admitting: *Deleted

## 2016-11-18 VITALS — Ht 70.0 in | Wt 164.5 lb

## 2016-11-18 DIAGNOSIS — I429 Cardiomyopathy, unspecified: Secondary | ICD-10-CM | POA: Diagnosis not present

## 2016-11-18 DIAGNOSIS — I5022 Chronic systolic (congestive) heart failure: Secondary | ICD-10-CM | POA: Insufficient documentation

## 2016-11-18 DIAGNOSIS — E7800 Pure hypercholesterolemia, unspecified: Secondary | ICD-10-CM | POA: Insufficient documentation

## 2016-11-18 NOTE — Patient Instructions (Signed)
Patient Instructions  Patient Details  Name: William Snyder MRN: [id number] Date of Birth: [birth year] Referring Provider:  Rusty Aus, MD  Below are the personal goals you chose as well as exercise and nutrition goals. Our goal is to help you keep on track towards obtaining and maintaining your goals. We will be discussing your progress on these goals with you throughout the program.  Initial Exercise Prescription:     Initial Exercise Prescription - 11/18/16 1400      Date of Initial Exercise RX and Referring Provider   Date 11/18/16   Referring Provider Sabra Heck     Treadmill   MPH 2.7   Grade 2.5   Minutes 15   METs 4     Recumbant Bike   Level 5   RPM 60   Watts 45   Minutes 15   METs 3.9     NuStep   Level 4   SPM 80   Minutes 15   METs 4     REL-XR   Level 3   Speed 50   Minutes 15   METs 4     Prescription Details   Frequency (times per week) 3   Duration Progress to 45 minutes of aerobic exercise without signs/symptoms of physical distress     Intensity   THRR 40-80% of Max Heartrate 96-128   Ratings of Perceived Exertion 11-13   Perceived Dyspnea 0-4     Resistance Training   Training Prescription Yes   Weight 3   Reps 10-15      Exercise Goals: Frequency: Be able to perform aerobic exercise three times per week working toward 3-5 days per week.  Intensity: Work with a perceived exertion of 11 (fairly light) - 15 (hard) as tolerated. Follow your new exercise prescription and watch for changes in prescription as you progress with the program. Changes will be reviewed with you when they are made.  Duration: You should be able to do 30 minutes of continuous aerobic exercise in addition to a 5 minute warm-up and a 5 minute cool-down routine.  Nutrition Goals: Your personal nutrition goals will be established when you do your nutrition analysis with the dietician.  The following are nutrition guidelines to follow: Cholesterol <  200mg /day Sodium < 1500mg /day Fiber: Men over 50 yrs - 30 grams per day  Personal Goals:     Personal Goals and Risk Factors at Admission - 11/18/16 1312      Core Components/Risk Factors/Patient Goals on Admission    Weight Management Weight Loss;Yes   Intervention Weight Management: Develop a combined nutrition and exercise program designed to reach desired caloric intake, while maintaining appropriate intake of nutrient and fiber, sodium and fats, and appropriate energy expenditure required for the weight goal.;Weight Management: Provide education and appropriate resources to help participant work on and attain dietary goals.;Weight Management/Obesity: Establish reasonable short term and long term weight goals.   Admit Weight 164 lb 8 oz (74.6 kg)   Goal Weight: Short Term 162 lb (73.5 kg)   Goal Weight: Long Term 150 lb (68 kg)   Expected Outcomes Short Term: Continue to assess and modify interventions until short term weight is achieved;Weight Loss: Understanding of general recommendations for a balanced deficit meal plan, which promotes 1-2 lb weight loss per week and includes a negative energy balance of [phone] kcal/d   Improve shortness of breath with ADL's Yes   Intervention Provide education, individualized exercise plan and daily activity instruction to help decrease  symptoms of SOB with activities of daily living.   Expected Outcomes Short Term: Achieves a reduction of symptoms when performing activities of daily living.   Diabetes Yes  Recent HgbA1c less than 6%   Intervention Provide education about signs/symptoms and action to take for hypo/hyperglycemia.;Provide education about proper nutrition, including hydration, and aerobic/resistive exercise prescription along with prescribed medications to achieve blood glucose in normal ranges: Fasting glucose 65-99 mg/dL   Expected Outcomes Short Term: Participant verbalizes understanding of the signs/symptoms and immediate care of  hyper/hypoglycemia, proper foot care and importance of medication, aerobic/resistive exercise and nutrition plan for blood glucose control.;Long Term: Attainment of HbA1C < 7%.   Heart Failure Yes   Intervention Provide a combined exercise and nutrition program that is supplemented with education, support and counseling about heart failure. Directed toward relieving symptoms such as shortness of breath, decreased exercise tolerance, and extremity edema.   Expected Outcomes Improve functional capacity of life;Short term: Attendance in program 2-3 days a week with increased exercise capacity. Reported lower sodium intake. Reported increased fruit and vegetable intake. Reports medication compliance.;Short term: Daily weights obtained and reported for increase. Utilizing diuretic protocols set by physician.;Long term: Adoption of self-care skills and reduction of barriers for early signs and symptoms recognition and intervention leading to self-care maintenance.   Hypertension Yes   Intervention Provide education on lifestyle modifcations including regular physical activity/exercise, weight management, moderate sodium restriction and increased consumption of fresh fruit, vegetables, and low fat dairy, alcohol moderation, and smoking cessation.;Monitor prescription use compliance.   Expected Outcomes Short Term: Continued assessment and intervention until BP is < 140/75mm HG in hypertensive participants. < 130/34mm HG in hypertensive participants with diabetes, heart failure or chronic kidney disease.;Long Term: Maintenance of blood pressure at goal levels.   Lipids Yes   Intervention Provide education and support for participant on nutrition & aerobic/resistive exercise along with prescribed medications to achieve LDL 70mg , HDL >40mg .   Expected Outcomes Short Term: Participant states understanding of desired cholesterol values and is compliant with medications prescribed. Participant is following exercise  prescription and nutrition guidelines.;Long Term: Cholesterol controlled with medications as prescribed, with individualized exercise RX and with personalized nutrition plan. Value goals: LDL < 70mg , HDL > 40 mg.      Tobacco Use Initial Evaluation: History  Smoking Status  . Never Smoker  Smokeless Tobacco  . Never Used    Exercise Goals and Review:     Exercise Goals    Row Name 11/18/16 1434             Exercise Goals   Increase Physical Activity Yes       Intervention Provide advice, education, support and counseling about physical activity/exercise needs.;Develop an individualized exercise prescription for aerobic and resistive training based on initial evaluation findings, risk stratification, comorbidities and participant's personal goals.       Expected Outcomes Achievement of increased cardiorespiratory fitness and enhanced flexibility, muscular endurance and strength shown through measurements of functional capacity and personal statement of participant.       Increase Strength and Stamina Yes       Intervention Provide advice, education, support and counseling about physical activity/exercise needs.;Develop an individualized exercise prescription for aerobic and resistive training based on initial evaluation findings, risk stratification, comorbidities and participant's personal goals.       Expected Outcomes Achievement of increased cardiorespiratory fitness and enhanced flexibility, muscular endurance and strength shown through measurements of functional capacity and personal statement of participant.  Able to understand and use rate of perceived exertion (RPE) scale Yes       Intervention Provide education and explanation on how to use RPE scale       Expected Outcomes Short Term: Able to use RPE daily in rehab to express subjective intensity level;Long Term:  Able to use RPE to guide intensity level when exercising independently       Able to understand and use  Dyspnea scale Yes       Intervention Provide education and explanation on how to use Dyspnea scale       Expected Outcomes Short Term: Able to use Dyspnea scale daily in rehab to express subjective sense of shortness of breath during exertion;Long Term: Able to use Dyspnea scale to guide intensity level when exercising independently       Knowledge and understanding of Target Heart Rate Range (THRR) Yes       Intervention Provide education and explanation of THRR including how the numbers were predicted and where they are located for reference       Expected Outcomes Short Term: Able to state/look up THRR;Short Term: Able to use daily as guideline for intensity in rehab;Long Term: Able to use THRR to govern intensity when exercising independently       Able to check pulse independently Yes       Intervention Provide education and demonstration on how to check pulse in carotid and radial arteries.;Review the importance of being able to check your own pulse for safety during independent exercise       Expected Outcomes Short Term: Able to explain why pulse checking is important during independent exercise;Long Term: Able to check pulse independently and accurately       Understanding of Exercise Prescription Yes       Intervention Provide education, explanation, and written materials on patient's individual exercise prescription       Expected Outcomes Short Term: Able to explain program exercise prescription;Long Term: Able to explain home exercise prescription to exercise independently          Copy of goals given to participant.

## 2016-11-18 NOTE — Progress Notes (Signed)
Cardiac Individual Treatment Plan  Patient Details  Name: William Snyder MRN: [id number] Date of Birth: 09/29/1940 Referring Provider:     Cardiac Rehab from 11/18/2016 in [facility] [place] Cardiac and Pulmonary Rehab  Referring Provider  Sabra Heck      Initial Encounter Date:    Cardiac Rehab from 11/18/2016 in [facility] Cardiac and Pulmonary Rehab  Date  11/18/16  Referring Provider  Sabra Heck      Visit Diagnosis: Heart failure, chronic systolic ([place])  Patient's Home Medications on Admission:  Current Outpatient Prescriptions:  .  aspirin 81 MG tablet, Take 81 mg by mouth daily., Disp: , Rfl:  .  furosemide (LASIX) 20 MG tablet, Take 20 mg by mouth 2 (two) times daily., Disp: , Rfl:  .  glimepiride (AMARYL) 4 MG tablet, Take 4 mg by mouth daily., Disp: , Rfl:  .  magnesium oxide (MAG-OX) 400 MG tablet, Take by mouth., Disp: , Rfl:  .  metFORMIN (GLUCOPHAGE) 500 MG tablet, Take by mouth., Disp: , Rfl:  .  metoprolol tartrate (LOPRESSOR) 25 MG tablet, Take by mouth., Disp: , Rfl:  .  sacubitril-valsartan (ENTRESTO) 24-26 MG, Take by mouth., Disp: , Rfl:  .  simvastatin (ZOCOR) 40 MG tablet, Take 40 mg by mouth daily., Disp: , Rfl:  .  sitaGLIPtin (JANUVIA) 50 MG tablet, Take by mouth., Disp: , Rfl:  .  spironolactone (ALDACTONE) 25 MG tablet, Take 25 mg by mouth daily., Disp: , Rfl:  .  potassium chloride (K-DUR) 10 MEQ tablet, Take 10 mEq by mouth 2 (two) times daily., Disp: , Rfl:  No current facility-administered medications for this visit.   Facility-Administered Medications Ordered in Other Visits:  .  0.9 %  sodium chloride infusion, 250 mL, Intravenous, PRN, Teodoro Spray, MD .  0.9 %  sodium chloride infusion, , Intravenous, Continuous, Fath, Javier Docker, MD .  sodium chloride flush (NS) 0.9 % injection 3 mL, 3 mL, Intravenous, Q12H, Fath, Kenneth A, MD .  sodium chloride flush (NS) 0.9 % injection 3 mL, 3 mL, Intravenous, PRN, Ubaldo Glassing Javier Docker, MD  Past Medical History: Past Medical  History:  Diagnosis Date  . Diabetes ([place])    Takes Metformin, Glipizide and Januvia  . High cholesterol   . Hypertension   . Prostate cancer [facility]) 2006   Prostatectomy.    Tobacco Use: History  Smoking Status  . Never Smoker  Smokeless Tobacco  . Never Used    Labs: Recent Review Flowsheet Data    There is no flowsheet data to display.       Exercise Target Goals: Date: 11/18/16  Exercise Program Goal: Individual exercise prescription set with THRR, safety & activity barriers. Participant demonstrates ability to understand and report RPE using BORG scale, to self-measure pulse accurately, and to acknowledge the importance of the exercise prescription.  Exercise Prescription Goal: Starting with aerobic activity 30 plus minutes a day, 3 days per week for initial exercise prescription. Provide home exercise prescription and guidelines that participant acknowledges understanding prior to discharge.  Activity Barriers & Risk Stratification:     Activity Barriers & Cardiac Risk Stratification - 11/18/16 1312      Activity Barriers & Cardiac Risk Stratification   Activity Barriers Shortness of Breath;Other (comment)   Comments fatigue   Cardiac Risk Stratification High      6 Minute Walk:     6 Minute Walk    Row Name 11/18/16 1435         6 Minute Walk  Distance 1360 feet     Walk Time 6 minutes     # of Rest Breaks 0     MPH 2.58     METS 2.99     RPE 15     VO2 Peak 10.5     Symptoms No     Resting HR 65 bpm     Resting BP 132/54     Resting Oxygen Saturation  100 %     Exercise Oxygen Saturation  during 6 min walk 99 %     Max Ex. HR 102 bpm     Max Ex. BP 110/56        Oxygen Initial Assessment:   Oxygen Re-Evaluation:   Oxygen Discharge (Final Oxygen Re-Evaluation):   Initial Exercise Prescription:     Initial Exercise Prescription - 11/18/16 1400      Date of Initial Exercise RX and Referring Provider   Date 11/18/16    Referring Provider Sabra Heck     Treadmill   MPH 2.7   Grade 2.5   Minutes 15   METs 4     Recumbant Bike   Level 5   RPM 60   Watts 45   Minutes 15   METs 3.9     NuStep   Level 4   SPM 80   Minutes 15   METs 4     REL-XR   Level 3   Speed 50   Minutes 15   METs 4     Prescription Details   Frequency (times per week) 3   Duration Progress to 45 minutes of aerobic exercise without signs/symptoms of physical distress     Intensity   THRR 40-80% of Max Heartrate 96-128   Ratings of Perceived Exertion 11-13   Perceived Dyspnea 0-4     Resistance Training   Training Prescription Yes   Weight 3   Reps 10-15      Perform Capillary Blood Glucose checks as needed.  Exercise Prescription Changes:   Exercise Comments:   Exercise Goals and Review:     Exercise Goals    Row Name 11/18/16 1434             Exercise Goals   Increase Physical Activity Yes       Intervention Provide advice, education, support and counseling about physical activity/exercise needs.;Develop an individualized exercise prescription for aerobic and resistive training based on initial evaluation findings, risk stratification, comorbidities and participant's personal goals.       Expected Outcomes Achievement of increased cardiorespiratory fitness and enhanced flexibility, muscular endurance and strength shown through measurements of functional capacity and personal statement of participant.       Increase Strength and Stamina Yes       Intervention Provide advice, education, support and counseling about physical activity/exercise needs.;Develop an individualized exercise prescription for aerobic and resistive training based on initial evaluation findings, risk stratification, comorbidities and participant's personal goals.       Expected Outcomes Achievement of increased cardiorespiratory fitness and enhanced flexibility, muscular endurance and strength shown through measurements of functional  capacity and personal statement of participant.       Able to understand and use rate of perceived exertion (RPE) scale Yes       Intervention Provide education and explanation on how to use RPE scale       Expected Outcomes Short Term: Able to use RPE daily in rehab to express subjective intensity level;Long Term:  Able to use RPE to  guide intensity level when exercising independently       Able to understand and use Dyspnea scale Yes       Intervention Provide education and explanation on how to use Dyspnea scale       Expected Outcomes Short Term: Able to use Dyspnea scale daily in rehab to express subjective sense of shortness of breath during exertion;Long Term: Able to use Dyspnea scale to guide intensity level when exercising independently       Knowledge and understanding of Target Heart Rate Range (THRR) Yes       Intervention Provide education and explanation of THRR including how the numbers were predicted and where they are located for reference       Expected Outcomes Short Term: Able to state/look up THRR;Short Term: Able to use daily as guideline for intensity in rehab;Long Term: Able to use THRR to govern intensity when exercising independently       Able to check pulse independently Yes       Intervention Provide education and demonstration on how to check pulse in carotid and radial arteries.;Review the importance of being able to check your own pulse for safety during independent exercise       Expected Outcomes Short Term: Able to explain why pulse checking is important during independent exercise;Long Term: Able to check pulse independently and accurately       Understanding of Exercise Prescription Yes       Intervention Provide education, explanation, and written materials on patient's individual exercise prescription       Expected Outcomes Short Term: Able to explain program exercise prescription;Long Term: Able to explain home exercise prescription to exercise independently           Exercise Goals Re-Evaluation :   Discharge Exercise Prescription (Final Exercise Prescription Changes):   Nutrition:  Target Goals: Understanding of nutrition guidelines, daily intake of sodium 1500mg , cholesterol 200mg , calories 30% from fat and 7% or less from saturated fats, daily to have 5 or more servings of fruits and vegetables.  Biometrics:     Pre Biometrics - 11/18/16 1434      Pre Biometrics   Height 5\' 10"  (1.778 m)   Weight 164 lb 8 oz (74.6 kg)   Waist Circumference 38 inches   Hip Circumference 42 inches   Waist to Hip Ratio 0.9 %   BMI (Calculated) 23.6   Single Leg Stand 30 seconds       Nutrition Therapy Plan and Nutrition Goals:     Nutrition Therapy & Goals - 11/18/16 1312      Intervention Plan   Intervention Prescribe, educate and counsel regarding individualized specific dietary modifications aiming towards targeted core components such as weight, hypertension, lipid management, diabetes, heart failure and other comorbidities.   Expected Outcomes Short Term Goal: Understand basic principles of dietary content, such as calories, fat, sodium, cholesterol and nutrients.;Short Term Goal: A plan has been developed with personal nutrition goals set during dietitian appointment.;Long Term Goal: Adherence to prescribed nutrition plan.      Nutrition Discharge: Rate Your Plate Scores:     Nutrition Assessments - 11/18/16 1312      MEDFICTS Scores   Pre Score 80      Nutrition Goals Re-Evaluation:   Nutrition Goals Discharge (Final Nutrition Goals Re-Evaluation):   Psychosocial: Target Goals: Acknowledge presence or absence of significant depression and/or stress, maximize coping skills, provide positive support system. Participant is able to verbalize types and ability to use techniques  and skills needed for reducing stress and depression.   Initial Review & Psychosocial Screening:     Initial Psych Review & Screening - 11/18/16  1314      Initial Review   Current issues with None Identified     Family Dynamics   Good Support System? Yes  wife Baker Janus     Barriers   Psychosocial barriers to participate in program There are no identifiable barriers or psychosocial needs.;The patient should benefit from training in stress management and relaxation.     Screening Interventions   Interventions Yes;Encouraged to exercise;To provide support and resources with identified psychosocial needs;Provide feedback about the scores to participant   Expected Outcomes Short Term goal: Utilizing psychosocial counselor, staff and physician to assist with identification of specific Stressors or current issues interfering with healing process. Setting desired goal for each stressor or current issue identified.;Long Term Goal: Stressors or current issues are controlled or eliminated.;Short Term goal: Identification and review with participant of any Quality of Life or Depression concerns found by scoring the questionnaire.;Long Term goal: The participant improves quality of Life and PHQ9 Scores as seen by post scores and/or verbalization of changes      Quality of Life Scores:      Quality of Life - 11/18/16 1315      Quality of Life Scores   Health/Function Pre 22.93 %   Socioeconomic Pre 29.58 %   Psych/Spiritual Pre 28.79 %   Family Pre 26.4 %   GLOBAL Pre 25.91 %      PHQ-9: Recent Review Flowsheet Data    Depression screen [facility] 2/9 11/18/2016   Decreased Interest 0   Down, Depressed, Hopeless 0   PHQ - 2 Score 0   Altered sleeping 0   Tired, decreased energy 2   Change in appetite 0   Feeling bad or failure about yourself  0   Trouble concentrating 0   Moving slowly or fidgety/restless 0   Suicidal thoughts 0   PHQ-9 Score 2   Difficult doing work/chores Somewhat difficult      Interpretation of Total Score  Total Score Depression Severity:  1-4 = Minimal depression, 5-9 = Mild depression, 10-14 = Moderate  depression, 15-19 = Moderately severe depression, 20-27 = Severe depression   Psychosocial Evaluation and Intervention:   Psychosocial Re-Evaluation:   Psychosocial Discharge (Final Psychosocial Re-Evaluation):   Vocational Rehabilitation: Provide vocational rehab assistance to qualifying candidates.   Vocational Rehab Evaluation & Intervention:     Vocational Rehab - 11/18/16 1318      Initial Vocational Rehab Evaluation & Intervention   Assessment shows need for Vocational Rehabilitation No      Education: Education Goals: Education classes will be provided on a variety of topics geared toward better understanding of heart health and risk factor modification. Participant will state understanding/return demonstration of topics presented as noted by education test scores.  Learning Barriers/Preferences:     Learning Barriers/Preferences - 11/18/16 1317      Learning Barriers/Preferences   Learning Barriers None   Learning Preferences None      Education Topics: General Nutrition Guidelines/Fats and Fiber: -Group instruction provided by verbal, written material, models and posters to present the general guidelines for heart healthy nutrition. Gives an explanation and review of dietary fats and fiber.   Controlling Sodium/Reading Food Labels: -Group verbal and written material supporting the discussion of sodium use in heart healthy nutrition. Review and explanation with models, verbal and written materials for utilization of the  food label.   Exercise Physiology & Risk Factors: - Group verbal and written instruction with models to review the exercise physiology of the cardiovascular system and associated critical values. Details cardiovascular disease risk factors and the goals associated with each risk factor.   Aerobic Exercise & Resistance Training: - Gives group verbal and written discussion on the health impact of inactivity. On the components of aerobic and  resistive training programs and the benefits of this training and how to safely progress through these programs.   Flexibility, Balance, General Exercise Guidelines: - Provides group verbal and written instruction on the benefits of flexibility and balance training programs. Provides general exercise guidelines with specific guidelines to those with heart or lung disease. Demonstration and skill practice provided.   Stress Management: - Provides group verbal and written instruction about the health risks of elevated stress, cause of high stress, and healthy ways to reduce stress.   Depression: - Provides group verbal and written instruction on the correlation between heart/lung disease and depressed mood, treatment options, and the stigmas associated with seeking treatment.   Anatomy & Physiology of the Heart: - Group verbal and written instruction and models provide basic cardiac anatomy and physiology, with the coronary electrical and arterial systems. Review of: AMI, Angina, Valve disease, Heart Failure, Cardiac Arrhythmia, Pacemakers, and the ICD.   Cardiac Procedures: - Group verbal and written instruction to review commonly prescribed medications for heart disease. Reviews the medication, class of the drug, and side effects. Includes the steps to properly store meds and maintain the prescription regimen. (beta blockers and nitrates)   Cardiac Medications I: - Group verbal and written instruction to review commonly prescribed medications for heart disease. Reviews the medication, class of the drug, and side effects. Includes the steps to properly store meds and maintain the prescription regimen.   Cardiac Medications II: -Group verbal and written instruction to review commonly prescribed medications for heart disease. Reviews the medication, class of the drug, and side effects. (all other drug classes)    Go Sex-Intimacy & Heart Disease, Get SMART - Goal Setting: - Group verbal  and written instruction through game format to discuss heart disease and the return to sexual intimacy. Provides group verbal and written material to discuss and apply goal setting through the application of the S.M.A.R.T. Method.   Other Matters of the Heart: - Provides group verbal, written materials and models to describe Heart Failure, Angina, Valve Disease, Peripheral Artery Disease, and Diabetes in the realm of heart disease. Includes description of the disease process and treatment options available to the cardiac patient.   Exercise & Equipment Safety: - Individual verbal instruction and demonstration of equipment use and safety with use of the equipment.   Cardiac Rehab from 11/18/2016 in [facility] Cardiac and Pulmonary Rehab  Date  11/18/16  Educator  [facility]  Instruction Review Code  1- Verbalizes Understanding      Infection Prevention: - Provides verbal and written material to individual with discussion of infection control including proper hand washing and proper equipment cleaning during exercise session.   Cardiac Rehab from 11/18/2016 in [facility] Cardiac and Pulmonary Rehab  Date  11/18/16  Educator  [facility]  Instruction Review Code  1- Verbalizes Understanding      Falls Prevention: - Provides verbal and written material to individual with discussion of falls prevention and safety.   Cardiac Rehab from 11/18/2016 in [facility] Cardiac and Pulmonary Rehab  Date  11/18/16  Educator  [facility]  Instruction Review Code  1- Verbalizes Understanding      Diabetes: - Individual verbal and written instruction to review signs/symptoms of diabetes, desired ranges of glucose level fasting, after meals and with exercise. Acknowledge that pre and post exercise glucose checks will be done for 3 sessions at entry of program.   Cardiac Rehab from 11/18/2016 in [facility] Cardiac and Pulmonary Rehab  Date  11/18/16  Educator  [facility]  Instruction Review Code  1- Verbalizes Understanding      Other: -Provides group and  verbal instruction on various topics (see comments)    Knowledge Questionnaire Score:     Knowledge Questionnaire Score - 11/18/16 1317      Knowledge Questionnaire Score   Pre Score 22/28  Correct response were reviewed with Juanda Crumble. He veralized understanding of the correct responses and had no questions.      Core Components/Risk Factors/Patient Goals at Admission:     Personal Goals and Risk Factors at Admission - 11/18/16 1312      Core Components/Risk Factors/Patient Goals on Admission    Weight Management Weight Loss;Yes   Intervention Weight Management: Develop a combined nutrition and exercise program designed to reach desired caloric intake, while maintaining appropriate intake of nutrient and fiber, sodium and fats, and appropriate energy expenditure required for the weight goal.;Weight Management: Provide education and appropriate resources to help participant work on and attain dietary goals.;Weight Management/Obesity: Establish reasonable short term and long term weight goals.   Admit Weight 164 lb 8 oz (74.6 kg)   Goal Weight: Short Term 162 lb (73.5 kg)   Goal Weight: Long Term 150 lb (68 kg)   Expected Outcomes Short Term: Continue to assess and modify interventions until short term weight is achieved;Weight Loss: Understanding of general recommendations for a balanced deficit meal plan, which promotes 1-2 lb weight loss per week and includes a negative energy balance of [phone] kcal/d   Improve shortness of breath with ADL's Yes   Intervention Provide education, individualized exercise plan and daily activity instruction to help decrease symptoms of SOB with activities of daily living.   Expected Outcomes Short Term: Achieves a reduction of symptoms when performing activities of daily living.   Diabetes Yes  Recent HgbA1c less than 6%   Intervention Provide education about signs/symptoms and action to take for hypo/hyperglycemia.;Provide education about proper  nutrition, including hydration, and aerobic/resistive exercise prescription along with prescribed medications to achieve blood glucose in normal ranges: Fasting glucose 65-99 mg/dL   Expected Outcomes Short Term: Participant verbalizes understanding of the signs/symptoms and immediate care of hyper/hypoglycemia, proper foot care and importance of medication, aerobic/resistive exercise and nutrition plan for blood glucose control.;Long Term: Attainment of HbA1C < 7%.   Heart Failure Yes   Intervention Provide a combined exercise and nutrition program that is supplemented with education, support and counseling about heart failure. Directed toward relieving symptoms such as shortness of breath, decreased exercise tolerance, and extremity edema.   Expected Outcomes Improve functional capacity of life;Short term: Attendance in program 2-3 days a week with increased exercise capacity. Reported lower sodium intake. Reported increased fruit and vegetable intake. Reports medication compliance.;Short term: Daily weights obtained and reported for increase. Utilizing diuretic protocols set by physician.;Long term: Adoption of self-care skills and reduction of barriers for early signs and symptoms recognition and intervention leading to self-care maintenance.   Hypertension Yes   Intervention Provide education on lifestyle modifcations including regular physical activity/exercise, weight management, moderate sodium restriction and increased consumption of fresh fruit, vegetables, and low  fat dairy, alcohol moderation, and smoking cessation.;Monitor prescription use compliance.   Expected Outcomes Short Term: Continued assessment and intervention until BP is < 140/10mm HG in hypertensive participants. < 130/69mm HG in hypertensive participants with diabetes, heart failure or chronic kidney disease.;Long Term: Maintenance of blood pressure at goal levels.   Lipids Yes   Intervention Provide education and support for  participant on nutrition & aerobic/resistive exercise along with prescribed medications to achieve LDL 70mg , HDL >40mg .   Expected Outcomes Short Term: Participant states understanding of desired cholesterol values and is compliant with medications prescribed. Participant is following exercise prescription and nutrition guidelines.;Long Term: Cholesterol controlled with medications as prescribed, with individualized exercise RX and with personalized nutrition plan. Value goals: LDL < 70mg , HDL > 40 mg.      Core Components/Risk Factors/Patient Goals Review:    Core Components/Risk Factors/Patient Goals at Discharge (Final Review):    ITP Comments:     ITP Comments    Row Name 11/18/16 1305           ITP Comments Medical Review completed today.  INitial ITP created and sent to Dr Sabra Heck for  cosign, review and changes as needed.  Documentation of diagnosis can be found in [facility]   07/22/2016 office visit.          Comments: initial ITP

## 2016-11-18 NOTE — Progress Notes (Signed)
Daily Session Note  Patient Details  Name: William Snyder MRN: [id number] Date of Birth: 01/17/1941 Referring Provider:     Cardiac Rehab from 11/18/2016 in [facility] Cardiac and Pulmonary Rehab  Referring Provider  Sabra Heck      Encounter Date: 11/18/2016  Check In:     Session Check In - 11/18/16 1304      Check-In   Location [facility]-Cardiac & Pulmonary Rehab   Staff Present Heath Lark, RN, BSN, CCRP;Meredith Sherryll Burger, RN Vickki Hearing, BA, ACSM CEP, Exercise Physiologist   Supervising physician immediately available to respond to emergencies See telemetry face sheet for immediately available ER MD   Medication changes reported     No   Fall or balance concerns reported    No   Warm-up and Cool-down Performed as group-led instruction   Resistance Training Performed Yes   VAD Patient? No     Pain Assessment   Currently in Pain? No/denies         History  Smoking Status  . Never Smoker  Smokeless Tobacco  . Never Used    Goals Met:  Proper associated with RPD/PD & O2 Sat Exercise tolerated well No report of cardiac concerns or symptoms Strength training completed today  Goals Unmet:  Not Applicable  Comments: med review completed   Dr. Emily Filbert is Medical Director for [facility] and [facility].

## 2016-11-22 ENCOUNTER — Encounter: Payer: PPO | Admitting: *Deleted

## 2016-11-22 DIAGNOSIS — I5022 Chronic systolic (congestive) heart failure: Secondary | ICD-10-CM | POA: Diagnosis not present

## 2016-11-22 LAB — GLUCOSE, CAPILLARY
Glucose-Capillary: 130 mg/dL — ABNORMAL HIGH (ref 65–99)
Glucose-Capillary: 150 mg/dL — ABNORMAL HIGH (ref 65–99)

## 2016-11-22 NOTE — Progress Notes (Signed)
Daily Session Note  Patient Details  Name: William Snyder MRN: [id number] Date of Birth: [birth year] Referring Provider:     Cardiac Rehab from 11/18/2016 in [facility] Cardiac and Pulmonary Rehab  Referring Provider  Sabra Heck      Encounter Date: 11/22/2016  Check In:     Session Check In - 11/22/16 0809      Check-In   Location [facility]-Cardiac & Pulmonary Rehab   Staff Present Gerlene Burdock, RN, Levie Heritage, MA, ACSM RCEP, Exercise Physiologist;Kelly Amedeo Plenty, BS, ACSM CEP, Exercise Physiologist   Supervising physician immediately available to respond to emergencies See telemetry face sheet for immediately available ER MD   Medication changes reported     No   Fall or balance concerns reported    No   Warm-up and Cool-down Performed on first and last piece of equipment   Resistance Training Performed Yes   VAD Patient? No     Pain Assessment   Currently in Pain? No/denies   Multiple Pain Sites No         History  Smoking Status  . Never Smoker  Smokeless Tobacco  . Never Used    Goals Met:  Exercise tolerated well Personal goals reviewed No report of cardiac concerns or symptoms Strength training completed today  Goals Unmet:  Not Applicable  Comments: First full day of exercise!  Patient was oriented to gym and equipment including functions, settings, policies, and procedures.  Patient's individual exercise prescription and treatment plan were reviewed.  All starting workloads were established based on the results of the 6 minute walk test done at initial orientation visit.  The plan for exercise progression was also introduced and progression will be customized based on patient's performance and goals.    Dr. Emily Filbert is Medical Director for [place] and [facility].

## 2016-11-24 DIAGNOSIS — I5022 Chronic systolic (congestive) heart failure: Secondary | ICD-10-CM

## 2016-11-24 LAB — GLUCOSE, CAPILLARY
Glucose-Capillary: 212 mg/dL — ABNORMAL HIGH (ref 65–99)
Glucose-Capillary: 180 mg/dL — ABNORMAL HIGH (ref 65–99)

## 2016-11-24 NOTE — Progress Notes (Signed)
Daily Session Note  Patient Details  Name: William Snyder MRN: [id number] Date of Birth: 1941-02-05 Referring Provider:     Cardiac Rehab from 11/18/2016 in [facility] Cardiac and Pulmonary Rehab  Referring Provider  Sabra Heck      Encounter Date: 11/24/2016  Check In:     Session Check In - 11/24/16 0808      Check-In   Location [facility]Cardiac & Pulmonary Rehab   Staff Present Alberteen Sam, MA, ACSM RCEP, Exercise Physiologist;Susanne Bice, RN, BSN, CCRP;Selisa Tensley Flavia Shipper   Supervising physician immediately available to respond to emergencies See telemetry face sheet for immediately available ER MD   Medication changes reported     No   Fall or balance concerns reported    No   Warm-up and Cool-down Performed on first and last piece of equipment   Resistance Training Performed Yes   VAD Patient? No     Pain Assessment   Currently in Pain? No/denies   Multiple Pain Sites No         History  Smoking Status  . Never Smoker  Smokeless Tobacco  . Never Used    Goals Met:  Independence with exercise equipment Exercise tolerated well No report of cardiac concerns or symptoms Strength training completed today  Goals Unmet:  Not Applicable  Comments: Pt able to follow exercise prescription today without complaint.  Will continue to monitor for progression.   Dr. Emily Filbert is Medical Director for [place] and [facility].

## 2016-11-25 DIAGNOSIS — E119 Type 2 diabetes mellitus without complications: Secondary | ICD-10-CM | POA: Diagnosis not present

## 2016-11-25 DIAGNOSIS — E1121 Type 2 diabetes mellitus with diabetic nephropathy: Secondary | ICD-10-CM | POA: Diagnosis not present

## 2016-11-25 DIAGNOSIS — E7800 Pure hypercholesterolemia, unspecified: Secondary | ICD-10-CM | POA: Diagnosis not present

## 2016-11-25 DIAGNOSIS — Z125 Encounter for screening for malignant neoplasm of prostate: Secondary | ICD-10-CM | POA: Diagnosis not present

## 2016-11-26 ENCOUNTER — Encounter: Payer: PPO | Admitting: *Deleted

## 2016-11-26 DIAGNOSIS — I5022 Chronic systolic (congestive) heart failure: Secondary | ICD-10-CM | POA: Diagnosis not present

## 2016-11-26 LAB — GLUCOSE, CAPILLARY
Glucose-Capillary: 142 mg/dL — ABNORMAL HIGH (ref 65–99)
Glucose-Capillary: 127 mg/dL — ABNORMAL HIGH (ref 65–99)

## 2016-11-26 NOTE — Progress Notes (Signed)
Daily Session Note  Patient Details  Name: William Snyder MRN: [id number] Date of Birth: Jul 14, 1940 Referring Provider:     Cardiac Rehab from 11/18/2016 in [facility] Cardiac and Pulmonary Rehab  Referring Provider  Sabra Heck      Encounter Date: 11/26/2016  Check In:     Session Check In - 11/26/16 0826      Check-In   Location [facility]-Cardiac & Pulmonary Rehab   Staff Present Gerlene Burdock, RN, Geralyn Corwin, RN Vickki Hearing, BA, ACSM CEP, Exercise Physiologist  [place], [place] EP   Supervising physician immediately available to respond to emergencies See telemetry face sheet for immediately available ER MD   Medication changes reported     No   Fall or balance concerns reported    No   Tobacco Cessation No Change   Warm-up and Cool-down Performed on first and last piece of equipment   Resistance Training Performed Yes   VAD Patient? No     Pain Assessment   Currently in Pain? No/denies   Multiple Pain Sites No         History  Smoking Status  . Never Smoker  Smokeless Tobacco  . Never Used    Goals Met:  Independence with exercise equipment Exercise tolerated well No report of cardiac concerns or symptoms Strength training completed today  Goals Unmet:  Not Applicable  Comments: Pt able to follow exercise prescription today without complaint.  Will continue to monitor for progression.    Dr. Emily Filbert is Medical Director for [place] and [facility].

## 2016-11-29 ENCOUNTER — Encounter: Payer: PPO | Attending: Internal Medicine | Admitting: *Deleted

## 2016-11-29 DIAGNOSIS — I429 Cardiomyopathy, unspecified: Secondary | ICD-10-CM | POA: Insufficient documentation

## 2016-11-29 DIAGNOSIS — I5022 Chronic systolic (congestive) heart failure: Secondary | ICD-10-CM | POA: Insufficient documentation

## 2016-11-29 NOTE — Progress Notes (Signed)
Daily Session Note  Patient Details  Name: William Snyder MRN: [id number] Date of Birth: 03-20-1940 Referring Provider:     Cardiac Rehab from 11/18/2016 in [facility] Cardiac and Pulmonary Rehab  Referring Provider  Sabra Heck      Encounter Date: 11/29/2016  Check In:     Session Check In - 11/29/16 0759      Check-In   Location [facility]-Cardiac & Pulmonary Rehab   Staff Present Alberteen Sam, MA, ACSM RCEP, Exercise Physiologist;Kelly Amedeo Plenty, BS, ACSM CEP, Exercise Physiologist;Meredith Sherryll Burger, RN BSN   Supervising physician immediately available to respond to emergencies See telemetry face sheet for immediately available ER MD   Medication changes reported     No   Fall or balance concerns reported    No   Warm-up and Cool-down Performed on first and last piece of equipment   Resistance Training Performed Yes   VAD Patient? No     Pain Assessment   Currently in Pain? No/denies   Multiple Pain Sites No         History  Smoking Status  . Never Smoker  Smokeless Tobacco  . Never Used    Goals Met:  Independence with exercise equipment Exercise tolerated well No report of cardiac concerns or symptoms Strength training completed today  Goals Unmet:  Not Applicable  Comments: Pt able to follow exercise prescription today without complaint.  Will continue to monitor for progression.  Reviewed home exercise with pt today.  Pt plans to continue walk and return to [employer] for exercise.  Reviewed THR, pulse, RPE, sign and symptoms, and when to call 911 or MD.  Also discussed weather considerations and indoor options.  Pt voiced understanding.   Dr. Emily Filbert is Medical Director for [place] and [facility].

## 2016-12-01 ENCOUNTER — Encounter: Payer: PPO | Admitting: *Deleted

## 2016-12-01 DIAGNOSIS — I5022 Chronic systolic (congestive) heart failure: Secondary | ICD-10-CM

## 2016-12-01 NOTE — Progress Notes (Signed)
Daily Session Note  Patient Details  Name: William Snyder MRN: [id number] Date of Birth: 06-Nov-1940 Referring Provider:     Cardiac Rehab from 11/18/2016 in [facility] Cardiac and Pulmonary Rehab  Referring Provider  Sabra Heck      Encounter Date: 12/01/2016  Check In:     Session Check In - 12/01/16 0806      Check-In   Location [facility]Cardiac & Pulmonary Rehab   Staff Present Nyoka Cowden, RN, BSN, Willette Pa, MA, ACSM RCEP, Exercise Physiologist;Joseph Flavia Shipper   Supervising physician immediately available to respond to emergencies See telemetry face sheet for immediately available ER MD   Medication changes reported     No   Fall or balance concerns reported    No   Warm-up and Cool-down Performed on first and last piece of equipment   Resistance Training Performed Yes   VAD Patient? No     Pain Assessment   Currently in Pain? No/denies   Multiple Pain Sites No         History  Smoking Status  . Never Smoker  Smokeless Tobacco  . Never Used    Goals Met:  Independence with exercise equipment Exercise tolerated well No report of cardiac concerns or symptoms Strength training completed today  Goals Unmet:  Not Applicable  Comments: Pt able to follow exercise prescription today without complaint.  Will continue to monitor for progression.    Dr. Emily Filbert is Medical Director for [place] and [facility].

## 2016-12-02 DIAGNOSIS — Z23 Encounter for immunization: Secondary | ICD-10-CM | POA: Diagnosis not present

## 2016-12-02 DIAGNOSIS — E119 Type 2 diabetes mellitus without complications: Secondary | ICD-10-CM | POA: Diagnosis not present

## 2016-12-02 DIAGNOSIS — I420 Dilated cardiomyopathy: Secondary | ICD-10-CM | POA: Diagnosis not present

## 2016-12-02 DIAGNOSIS — Z0000 Encounter for general adult medical examination without abnormal findings: Secondary | ICD-10-CM | POA: Diagnosis not present

## 2016-12-03 DIAGNOSIS — I5022 Chronic systolic (congestive) heart failure: Secondary | ICD-10-CM | POA: Diagnosis not present

## 2016-12-03 NOTE — Progress Notes (Signed)
Daily Session Note  Patient Details  Name: William Snyder MRN: [id number] Date of Birth: [birth year] Referring Provider:     Cardiac Rehab from 11/18/2016 in [facility] Cardiac and Pulmonary Rehab  Referring Provider  Sabra Heck      Encounter Date: 12/03/2016  Check In:     Session Check In - 12/03/16 0836      Check-In   Location [facility]-Cardiac & Pulmonary Rehab   Staff Present Gerlene Burdock, RN, BSN;Jessica Luan Pulling, MA, ACSM RCEP, Exercise Physiologist;Edge Mauger Oletta Darter, BA, ACSM CEP, Exercise Physiologist   Supervising physician immediately available to respond to emergencies See telemetry face sheet for immediately available ER MD   Medication changes reported     No   Fall or balance concerns reported    No   Warm-up and Cool-down Performed on first and last piece of equipment   Resistance Training Performed Yes   VAD Patient? No     Pain Assessment   Currently in Pain? No/denies           Exercise Prescription Changes - 12/02/16 1400      Response to Exercise   Blood Pressure (Admit) 134/74   Blood Pressure (Exercise) 146/70   Blood Pressure (Exit) 90/50   Heart Rate (Admit) 63 bpm   Heart Rate (Exercise) 116 bpm   Heart Rate (Exit) 77 bpm   Rating of Perceived Exertion (Exercise) 13   Symptoms none   Duration Progress to 45 minutes of aerobic exercise without signs/symptoms of physical distress   Intensity THRR unchanged     Progression   Progression Continue to progress workloads to maintain intensity without signs/symptoms of physical distress.   Average METs 3.99     Resistance Training   Training Prescription Yes   Weight 3 lbs   Reps 10-15     Interval Training   Interval Training No     Treadmill   MPH 3.5   Grade 3   Minutes 15   METs 5.13     Recumbant Bike   Level 5   Watts 25   Minutes 15   METs 3.21     REL-XR   Level 3   Minutes 15   METs 3.8     Home Exercise Plan   Plans to continue exercise at [employer] (comment)   walking, [employer]   Frequency Add 2 additional days to program exercise sessions.   Initial Home Exercises Provided 11/29/16      History  Smoking Status  . Never Smoker  Smokeless Tobacco  . Never Used    Goals Met:  Independence with exercise equipment Exercise tolerated well No report of cardiac concerns or symptoms Strength training completed today  Goals Unmet:  Not Applicable  Comments: Pt able to follow exercise prescription today without complaint.  Will continue to monitor for progression.    Dr. Emily Filbert is Medical Director for [place] and [facility].

## 2016-12-06 ENCOUNTER — Encounter: Payer: PPO | Admitting: *Deleted

## 2016-12-06 DIAGNOSIS — I5022 Chronic systolic (congestive) heart failure: Secondary | ICD-10-CM | POA: Diagnosis not present

## 2016-12-06 NOTE — Progress Notes (Signed)
Daily Session Note  Patient Details  Name: William Snyder MRN: [id number] Date of Birth: [birth year] Referring Provider:     Cardiac Rehab from 11/18/2016 in [facility] Cardiac and Pulmonary Rehab  Referring Provider  Sabra Heck      Encounter Date: 12/06/2016  Check In:     Session Check In - 12/06/16 0804      Check-In   Location [facility]-Cardiac & Pulmonary Rehab   Staff Present Earlean Shawl, BS, ACSM CEP, Exercise Physiologist;Carroll Enterkin, RN, Levie Heritage, MA, ACSM RCEP, Exercise Physiologist   Supervising physician immediately available to respond to emergencies See telemetry face sheet for immediately available ER MD   Medication changes reported     No   Fall or balance concerns reported    No   Warm-up and Cool-down Performed on first and last piece of equipment   Resistance Training Performed Yes   VAD Patient? No     Pain Assessment   Currently in Pain? No/denies   Multiple Pain Sites No         History  Smoking Status  . Never Smoker  Smokeless Tobacco  . Never Used    Goals Met:  Independence with exercise equipment Exercise tolerated well No report of cardiac concerns or symptoms Strength training completed today  Goals Unmet:  Not Applicable  Comments: Pt able to follow exercise prescription today without complaint.  Will continue to monitor for progression.    Dr. Emily Filbert is Medical Director for [place] and [facility].

## 2016-12-08 ENCOUNTER — Encounter: Payer: Self-pay | Admitting: *Deleted

## 2016-12-08 DIAGNOSIS — I5022 Chronic systolic (congestive) heart failure: Secondary | ICD-10-CM

## 2016-12-08 NOTE — Progress Notes (Signed)
Daily Session Note  Patient Details  Name: William Snyder MRN: [id number] Date of Birth: June 08, 1940 Referring Provider:     Cardiac Rehab from 11/18/2016 in [facility] Cardiac and Pulmonary Rehab  Referring Provider  Sabra Heck      Encounter Date: 12/08/2016  Check In:     Session Check In - 12/08/16 0758      Check-In   Location [facility]-Cardiac & Pulmonary Rehab   Staff Present Alberteen Sam, MA, ACSM RCEP, Exercise Physiologist;Mary Kellie Shropshire, RN, BSN, Lauretta Grill RCP,RRT,BSRT   Supervising physician immediately available to respond to emergencies See telemetry face sheet for immediately available ER MD   Medication changes reported     No   Fall or balance concerns reported    No   Warm-up and Cool-down Performed on first and last piece of equipment   Resistance Training Performed Yes   VAD Patient? No     Pain Assessment   Currently in Pain? No/denies   Multiple Pain Sites No         History  Smoking Status  . Never Smoker  Smokeless Tobacco  . Never Used    Goals Met:  Independence with exercise equipment Exercise tolerated well No report of cardiac concerns or symptoms Strength training completed today  Goals Unmet:  Not Applicable  Comments: Pt able to follow exercise prescription today without complaint.  Will continue to monitor for progression.   Dr. Emily Filbert is Medical Director for [place] and [facility].

## 2016-12-08 NOTE — Progress Notes (Signed)
Cardiac Individual Treatment Plan  Patient Details  Name: William Snyder MRN: [id number] Date of Birth: 1940/04/10 Referring Provider:     Cardiac Rehab from 11/18/2016 in [facility] Cardiac and Pulmonary Rehab  Referring Provider  Sabra Heck      Initial Encounter Date:    Cardiac Rehab from 11/18/2016 in [facility] Cardiac and Pulmonary Rehab  Date  11/18/16  Referring Provider  Sabra Heck      Visit Diagnosis: No diagnosis found.  Patient's Home Medications on Admission:  Current Outpatient Prescriptions:  .  aspirin 81 MG tablet, Take 81 mg by mouth daily., Disp: , Rfl:  .  furosemide (LASIX) 20 MG tablet, Take 20 mg by mouth 2 (two) times daily., Disp: , Rfl:  .  glimepiride (AMARYL) 4 MG tablet, Take 4 mg by mouth daily., Disp: , Rfl:  .  magnesium oxide (MAG-OX) 400 MG tablet, Take by mouth., Disp: , Rfl:  .  metFORMIN (GLUCOPHAGE) 500 MG tablet, Take by mouth., Disp: , Rfl:  .  metoprolol tartrate (LOPRESSOR) 25 MG tablet, Take by mouth., Disp: , Rfl:  .  potassium chloride (K-DUR) 10 MEQ tablet, Take 10 mEq by mouth 2 (two) times daily., Disp: , Rfl:  .  sacubitril-valsartan (ENTRESTO) 24-26 MG, Take by mouth., Disp: , Rfl:  .  simvastatin (ZOCOR) 40 MG tablet, Take 40 mg by mouth daily., Disp: , Rfl:  .  sitaGLIPtin (JANUVIA) 50 MG tablet, Take by mouth., Disp: , Rfl:  .  spironolactone (ALDACTONE) 25 MG tablet, Take 25 mg by mouth daily., Disp: , Rfl:  No current facility-administered medications for this visit.   Facility-Administered Medications Ordered in Other Visits:  .  0.9 %  sodium chloride infusion, 250 mL, Intravenous, PRN, Teodoro Spray, MD .  0.9 %  sodium chloride infusion, , Intravenous, Continuous, Fath, Javier Docker, MD .  sodium chloride flush (NS) 0.9 % injection 3 mL, 3 mL, Intravenous, Q12H, Fath, Kenneth A, MD .  sodium chloride flush (NS) 0.9 % injection 3 mL, 3 mL, Intravenous, PRN, Ubaldo Glassing Javier Docker, MD  Past Medical History: Past Medical History:  Diagnosis  Date  . Diabetes ([place])    Takes Metformin, Glipizide and Januvia  . High cholesterol   . Hypertension   . Prostate cancer [facility]) 2006   Prostatectomy.    Tobacco Use: History  Smoking Status  . Never Smoker  Smokeless Tobacco  . Never Used    Labs: Recent Review Flowsheet Data    There is no flowsheet data to display.       Exercise Target Goals:    Exercise Program Goal: Individual exercise prescription set with THRR, safety & activity barriers. Participant demonstrates ability to understand and report RPE using BORG scale, to self-measure pulse accurately, and to acknowledge the importance of the exercise prescription.  Exercise Prescription Goal: Starting with aerobic activity 30 plus minutes a day, 3 days per week for initial exercise prescription. Provide home exercise prescription and guidelines that participant acknowledges understanding prior to discharge.  Activity Barriers & Risk Stratification:     Activity Barriers & Cardiac Risk Stratification - 11/18/16 1312      Activity Barriers & Cardiac Risk Stratification   Activity Barriers Shortness of Breath;Other (comment)   Comments fatigue   Cardiac Risk Stratification High      6 Minute Walk:     6 Minute Walk    Row Name 11/18/16 1435         6 Minute Walk   Distance  1360 feet     Walk Time 6 minutes     # of Rest Breaks 0     MPH 2.58     METS 2.99     RPE 15     VO2 Peak 10.5     Symptoms No     Resting HR 65 bpm     Resting BP 132/54     Resting Oxygen Saturation  100 %     Exercise Oxygen Saturation  during 6 min walk 99 %     Max Ex. HR 102 bpm     Max Ex. BP 110/56        Oxygen Initial Assessment:   Oxygen Re-Evaluation:   Oxygen Discharge (Final Oxygen Re-Evaluation):   Initial Exercise Prescription:     Initial Exercise Prescription - 11/18/16 1400      Date of Initial Exercise RX and Referring Provider   Date 11/18/16   Referring Provider Sabra Heck     Treadmill    MPH 2.7   Grade 2.5   Minutes 15   METs 4     Recumbant Bike   Level 5   RPM 60   Watts 45   Minutes 15   METs 3.9     NuStep   Level 4   SPM 80   Minutes 15   METs 4     REL-XR   Level 3   Speed 50   Minutes 15   METs 4     Prescription Details   Frequency (times per week) 3   Duration Progress to 45 minutes of aerobic exercise without signs/symptoms of physical distress     Intensity   THRR 40-80% of Max Heartrate 96-128   Ratings of Perceived Exertion 11-13   Perceived Dyspnea 0-4     Resistance Training   Training Prescription Yes   Weight 3   Reps 10-15      Perform Capillary Blood Glucose checks as needed.  Exercise Prescription Changes:     Exercise Prescription Changes    Row Name 11/29/16 0800 12/02/16 1400           Response to Exercise   Blood Pressure (Admit)  - 134/74      Blood Pressure (Exercise)  - 146/70      Blood Pressure (Exit)  - 90/50      Heart Rate (Admit)  - 63 bpm      Heart Rate (Exercise)  - 116 bpm      Heart Rate (Exit)  - 77 bpm      Rating of Perceived Exertion (Exercise)  - 13      Symptoms  - none      Duration  - Progress to 45 minutes of aerobic exercise without signs/symptoms of physical distress      Intensity  - THRR unchanged        Progression   Progression  - Continue to progress workloads to maintain intensity without signs/symptoms of physical distress.      Average METs  - 3.99        Resistance Training   Training Prescription  - Yes      Weight  - 3 lbs      Reps  - 10-15        Interval Training   Interval Training  - No        Treadmill   MPH  - 3.5      Grade  - 3  Minutes  - 15      METs  - 5.13        Recumbant Bike   Level  - 5      Watts  - 25      Minutes  - 15      METs  - 3.21        REL-XR   Level  - 3      Minutes  - 15      METs  - 3.8        Home Exercise Plan   Plans to continue exercise at [employer] (comment)  walking, [employer] (comment)  walking, [employer]      Frequency Add 2 additional days to program exercise sessions. Add 2 additional days to program exercise sessions.      Initial Home Exercises Provided 11/29/16 11/29/16         Exercise Comments:     Exercise Comments    Row Name 11/22/16 [phone]           Exercise Comments  First full day of exercise!  Patient was oriented to gym and equipment including functions, settings, policies, and procedures.  Patient's individual exercise prescription and treatment plan were reviewed.  All starting workloads were established based on the results of the 6 minute walk test done at initial orientation visit.  The plan for exercise progression was also introduced and progression will be customized based on patient's performance and goals.          Exercise Goals and Review:     Exercise Goals    Row Name 11/18/16 1434             Exercise Goals   Increase Physical Activity Yes       Intervention Provide advice, education, support and counseling about physical activity/exercise needs.;Develop an individualized exercise prescription for aerobic and resistive training based on initial evaluation findings, risk stratification, comorbidities and participant's personal goals.       Expected Outcomes Achievement of increased cardiorespiratory fitness and enhanced flexibility, muscular endurance and strength shown through measurements of functional capacity and personal statement of participant.       Increase Strength and Stamina Yes       Intervention Provide advice, education, support and counseling about physical activity/exercise needs.;Develop an individualized exercise prescription for aerobic and resistive training based on initial evaluation findings, risk stratification, comorbidities and participant's personal goals.       Expected Outcomes Achievement of increased cardiorespiratory fitness and enhanced flexibility, muscular endurance and  strength shown through measurements of functional capacity and personal statement of participant.       Able to understand and use rate of perceived exertion (RPE) scale Yes       Intervention Provide education and explanation on how to use RPE scale       Expected Outcomes Short Term: Able to use RPE daily in rehab to express subjective intensity level;Long Term:  Able to use RPE to guide intensity level when exercising independently       Able to understand and use Dyspnea scale Yes       Intervention Provide education and explanation on how to use Dyspnea scale       Expected Outcomes Short Term: Able to use Dyspnea scale daily in rehab to express subjective sense of shortness of breath during exertion;Long Term: Able to use Dyspnea scale to guide intensity level when exercising independently  Knowledge and understanding of Target Heart Rate Range (THRR) Yes       Intervention Provide education and explanation of THRR including how the numbers were predicted and where they are located for reference       Expected Outcomes Short Term: Able to state/look up THRR;Short Term: Able to use daily as guideline for intensity in rehab;Long Term: Able to use THRR to govern intensity when exercising independently       Able to check pulse independently Yes       Intervention Provide education and demonstration on how to check pulse in carotid and radial arteries.;Review the importance of being able to check your own pulse for safety during independent exercise       Expected Outcomes Short Term: Able to explain why pulse checking is important during independent exercise;Long Term: Able to check pulse independently and accurately       Understanding of Exercise Prescription Yes       Intervention Provide education, explanation, and written materials on patient's individual exercise prescription       Expected Outcomes Short Term: Able to explain program exercise prescription;Long Term: Able to explain home  exercise prescription to exercise independently          Exercise Goals Re-Evaluation :     Exercise Goals Re-Evaluation    Row Name 11/22/16 5956 11/29/16 0848 12/02/16 1441         Exercise Goal Re-Evaluation   Exercise Goals Review Understanding of Exercise Prescription;Knowledge and understanding of Target Heart Rate Range (THRR);Able to understand and use rate of perceived exertion (RPE) scale Increase Physical Activity;Knowledge and understanding of Target Heart Rate Range (THRR);Able to check pulse independently;Able to understand and use rate of perceived exertion (RPE) scale;Understanding of Exercise Prescription Increase Physical Activity;Increase Strength and Stamina     Comments Reviewed RPE scale, THR and program prescription with pt today.  Pt voiced understanding and was given a copy of goals to take home.  Reviewed home exercise with pt today.  Pt plans to continue walk and return to [employer] for exercise.  Reviewed THR, pulse, RPE, sign and symptoms, and when to call 911 or MD.  Also discussed weather considerations and indoor options.  Pt voiced understanding. Naphtali is off to a good start in rehab.  He is already up to 3.5 mph on the treadmill.  He is feeling better and already returned to the gym.  We will continue to monitor his progression.      Expected Outcomes Short: Use RPE daily to regulate intensity.  Long: Follow program prescription in THR. Short: He going to return to gym two days a week.  Long: Continue to exercise independently Short: Reutrn to gym two days a week.  Long: Continue to build strength and stamina with exercise.         Discharge Exercise Prescription (Final Exercise Prescription Changes):     Exercise Prescription Changes - 12/02/16 1400      Response to Exercise   Blood Pressure (Admit) 134/74   Blood Pressure (Exercise) 146/70   Blood Pressure (Exit) 90/50   Heart Rate (Admit) 63 bpm   Heart Rate (Exercise) 116 bpm   Heart Rate  (Exit) 77 bpm   Rating of Perceived Exertion (Exercise) 13   Symptoms none   Duration Progress to 45 minutes of aerobic exercise without signs/symptoms of physical distress   Intensity THRR unchanged     Progression   Progression Continue to progress workloads to maintain intensity  without signs/symptoms of physical distress.   Average METs 3.99     Resistance Training   Training Prescription Yes   Weight 3 lbs   Reps 10-15     Interval Training   Interval Training No     Treadmill   MPH 3.5   Grade 3   Minutes 15   METs 5.13     Recumbant Bike   Level 5   Watts 25   Minutes 15   METs 3.21     REL-XR   Level 3   Minutes 15   METs 3.8     Home Exercise Plan   Plans to continue exercise at [employer] (comment)  walking, [employer]   Frequency Add 2 additional days to program exercise sessions.   Initial Home Exercises Provided 11/29/16      Nutrition:  Target Goals: Understanding of nutrition guidelines, daily intake of sodium <1529m, cholesterol <2079m calories 30% from fat and 7% or less from saturated fats, daily to have 5 or more servings of fruits and vegetables.  Biometrics:     Pre Biometrics - 11/18/16 1434      Pre Biometrics   Height _0  (1.778 m)   Weight 164 lb 8 oz (74.6 kg)   Waist Circumference 38 inches   Hip Circumference 42 inches   Waist to Hip Ratio 0.9 %   BMI (Calculated) 23.6   Single Leg Stand 30 seconds       Nutrition Therapy Plan and Nutrition Goals:     Nutrition Therapy & Goals - 11/18/16 1312      Intervention Plan   Intervention Prescribe, educate and counsel regarding individualized specific dietary modifications aiming towards targeted core components such as weight, hypertension, lipid management, diabetes, heart failure and other comorbidities.   Expected Outcomes Short Term Goal: Understand basic principles of dietary content, such as calories, fat, sodium, cholesterol and nutrients.;Short Term  Goal: A plan has been developed with personal nutrition goals set during dietitian appointment.;Long Term Goal: Adherence to prescribed nutrition plan.      Nutrition Discharge: Rate Your Plate Scores:     Nutrition Assessments - 11/18/16 1312      MEDFICTS Scores   Pre Score 80      Nutrition Goals Re-Evaluation:   Nutrition Goals Discharge (Final Nutrition Goals Re-Evaluation):   Psychosocial: Target Goals: Acknowledge presence or absence of significant depression and/or stress, maximize coping skills, provide positive support system. Participant is able to verbalize types and ability to use techniques and skills needed for reducing stress and depression.   Initial Review & Psychosocial Screening:     Initial Psych Review & Screening - 11/18/16 1314      Initial Review   Current issues with None Identified     Family Dynamics   Good Support System? Yes  wife GaBaker Janus   Barriers   Psychosocial barriers to participate in program There are no identifiable barriers or psychosocial needs.;The patient should benefit from training in stress management and relaxation.     Screening Interventions   Interventions Yes;Encouraged to exercise;To provide support and resources with identified psychosocial needs;Provide feedback about the scores to participant   Expected Outcomes Short Term goal: Utilizing psychosocial counselor, staff and physician to assist with identification of specific Stressors or current issues interfering with healing process. Setting desired goal for each stressor or current issue identified.;Long Term Goal: Stressors or current issues are controlled or eliminated.;Short Term goal: Identification and review with participant of  any Quality of Life or Depression concerns found by scoring the questionnaire.;Long Term goal: The participant improves quality of Life and PHQ9 Scores as seen by post scores and/or verbalization of changes      Quality of Life Scores:       Quality of Life - 11/18/16 1315      Quality of Life Scores   Health/Function Pre 22.93 %   Socioeconomic Pre 29.58 %   Psych/Spiritual Pre 28.79 %   Family Pre 26.4 %   GLOBAL Pre 25.91 %      PHQ-9: Recent Review Flowsheet Data    Depression screen [facility] 2/9 11/18/2016   Decreased Interest 0   Down, Depressed, Hopeless 0   PHQ - 2 Score 0   Altered sleeping 0   Tired, decreased energy 2   Change in appetite 0   Feeling bad or failure about yourself  0   Trouble concentrating 0   Moving slowly or fidgety/restless 0   Suicidal thoughts 0   PHQ-9 Score 2   Difficult doing work/chores Somewhat difficult      Interpretation of Total Score  Total Score Depression Severity:  1-4 = Minimal depression, 5-9 = Mild depression, 10-14 = Moderate depression, 15-19 = Moderately severe depression, 20-27 = Severe depression   Psychosocial Evaluation and Intervention:     Psychosocial Evaluation - 11/22/16 0950      Psychosocial Evaluation & Interventions   Interventions Encouraged to exercise with the program and follow exercise prescription   Comments Counselor met with Mr. Marcello Moores [facility]) for initial psychosocial evaluation.  He is a 76 year old who had a defibrolator implanted in July and is ordered to this program.  Arvine has a strong support system with a spouse of 66 years; two daughters locally and active involvement in his local church.  Other than his heart issues, Robbin has diabetes as well.  He sleeps well and has a good appetite.  Baptiste denies a history of depression or anxiety or any current symptoms and reports his mood is generally positive most of the time.  He has goals to have more energy and feel strong enough to increase his activity back to 5x week at gym and volunteering 2-3x per week.  He will be followed by staff throughout the course of this program.     Expected Outcomes Treyshon will benefit from consistent exercise to achieve his stated goals.  The  educational and psychoeducational components of this program will be helpful as well in understanding and managing his heart disease.   Continue Psychosocial Services  Follow up required by staff      Psychosocial Re-Evaluation:   Psychosocial Discharge (Final Psychosocial Re-Evaluation):   Vocational Rehabilitation: Provide vocational rehab assistance to qualifying candidates.   Vocational Rehab Evaluation & Intervention:     Vocational Rehab - 11/18/16 1318      Initial Vocational Rehab Evaluation & Intervention   Assessment shows need for Vocational Rehabilitation No      Education: Education Goals: Education classes will be provided on a variety of topics geared toward better understanding of heart health and risk factor modification. Participant will state understanding/return demonstration of topics presented as noted by education test scores.  Learning Barriers/Preferences:     Learning Barriers/Preferences - 11/18/16 1317      Learning Barriers/Preferences   Learning Barriers None   Learning Preferences None      Education Topics: General Nutrition Guidelines/Fats and Fiber: -Group instruction provided by verbal, written material, models  and posters to present the general guidelines for heart healthy nutrition. Gives an explanation and review of dietary fats and fiber.   Controlling Sodium/Reading Food Labels: -Group verbal and written material supporting the discussion of sodium use in heart healthy nutrition. Review and explanation with models, verbal and written materials for utilization of the food label.   Exercise Physiology & Risk Factors: - Group verbal and written instruction with models to review the exercise physiology of the cardiovascular system and associated critical values. Details cardiovascular disease risk factors and the goals associated with each risk factor.   Aerobic Exercise & Resistance Training: - Gives group verbal and written  discussion on the health impact of inactivity. On the components of aerobic and resistive training programs and the benefits of this training and how to safely progress through these programs.   Flexibility, Balance, General Exercise Guidelines: - Provides group verbal and written instruction on the benefits of flexibility and balance training programs. Provides general exercise guidelines with specific guidelines to those with heart or lung disease. Demonstration and skill practice provided.   Stress Management: - Provides group verbal and written instruction about the health risks of elevated stress, cause of high stress, and healthy ways to reduce stress.   Depression: - Provides group verbal and written instruction on the correlation between heart/lung disease and depressed mood, treatment options, and the stigmas associated with seeking treatment.   Anatomy & Physiology of the Heart: - Group verbal and written instruction and models provide basic cardiac anatomy and physiology, with the coronary electrical and arterial systems. Review of: AMI, Angina, Valve disease, Heart Failure, Cardiac Arrhythmia, Pacemakers, and the ICD.   Cardiac Procedures: - Group verbal and written instruction to review commonly prescribed medications for heart disease. Reviews the medication, class of the drug, and side effects. Includes the steps to properly store meds and maintain the prescription regimen. (beta blockers and nitrates)   Cardiac Rehab from 12/06/2016 in [facility] Cardiac and Pulmonary Rehab  Date  11/22/16  Educator  CE  Instruction Review Code  2- Demonstrated Understanding      Cardiac Medications I: - Group verbal and written instruction to review commonly prescribed medications for heart disease. Reviews the medication, class of the drug, and side effects. Includes the steps to properly store meds and maintain the prescription regimen.   Cardiac Rehab from 12/06/2016 in [facility] Cardiac and  Pulmonary Rehab  Date  11/29/16  Educator  [facility]  Instruction Review Code  1- Verbalizes Understanding      Cardiac Medications II: -Group verbal and written instruction to review commonly prescribed medications for heart disease. Reviews the medication, class of the drug, and side effects. (all other drug classes)    Go Sex-Intimacy & Heart Disease, Get SMART - Goal Setting: - Group verbal and written instruction through game format to discuss heart disease and the return to sexual intimacy. Provides group verbal and written material to discuss and apply goal setting through the application of the S.M.A.R.T. Method.   Cardiac Rehab from 12/06/2016 in [facility] Cardiac and Pulmonary Rehab  Date  11/22/16  Educator  CE  Instruction Review Code  2- Demonstrated Understanding      Other Matters of the Heart: - Provides group verbal, written materials and models to describe Heart Failure, Angina, Valve Disease, Peripheral Artery Disease, and Diabetes in the realm of heart disease. Includes description of the disease process and treatment options available to the cardiac patient.   Exercise & Equipment Safety: - Individual  verbal instruction and demonstration of equipment use and safety with use of the equipment.   Cardiac Rehab from 12/06/2016 in [facility] Cardiac and Pulmonary Rehab  Date  11/18/16  Educator  [facility]  Instruction Review Code  1- Verbalizes Understanding      Infection Prevention: - Provides verbal and written material to individual with discussion of infection control including proper hand washing and proper equipment cleaning during exercise session.   Cardiac Rehab from 12/06/2016 in [facility] Cardiac and Pulmonary Rehab  Date  11/18/16  Educator  [facility]  Instruction Review Code  1- Verbalizes Understanding      Falls Prevention: - Provides verbal and written material to individual with discussion of falls prevention and safety.   Cardiac Rehab from 12/06/2016 in [facility] Cardiac and  Pulmonary Rehab  Date  11/18/16  Educator  [facility]  Instruction Review Code  1- Verbalizes Understanding      Diabetes: - Individual verbal and written instruction to review signs/symptoms of diabetes, desired ranges of glucose level fasting, after meals and with exercise. Acknowledge that pre and post exercise glucose checks will be done for 3 sessions at entry of program.   Cardiac Rehab from 12/06/2016 in [facility] Cardiac and Pulmonary Rehab  Date  11/18/16  Educator  [facility]  Instruction Review Code  1- Verbalizes Understanding      Other: -Provides group and verbal instruction on various topics (see comments)    Knowledge Questionnaire Score:     Knowledge Questionnaire Score - 11/18/16 1317      Knowledge Questionnaire Score   Pre Score 22/28  Correct response were reviewed with Juanda Crumble. He veralized understanding of the correct responses and had no questions.      Core Components/Risk Factors/Patient Goals at Admission:     Personal Goals and Risk Factors at Admission - 11/18/16 1312      Core Components/Risk Factors/Patient Goals on Admission    Weight Management Weight Loss;Yes   Intervention Weight Management: Develop a combined nutrition and exercise program designed to reach desired caloric intake, while maintaining appropriate intake of nutrient and fiber, sodium and fats, and appropriate energy expenditure required for the weight goal.;Weight Management: Provide education and appropriate resources to help participant work on and attain dietary goals.;Weight Management/Obesity: Establish reasonable short term and long term weight goals.   Admit Weight 164 lb 8 oz (74.6 kg)   Goal Weight: Short Term 162 lb (73.5 kg)   Goal Weight: Long Term 150 lb (68 kg)   Expected Outcomes Short Term: Continue to assess and modify interventions until short term weight is achieved;Weight Loss: Understanding of general recommendations for a balanced deficit meal plan, which promotes 1-2 lb  weight loss per week and includes a negative energy balance of [phone] kcal/d   Improve shortness of breath with ADL's Yes   Intervention Provide education, individualized exercise plan and daily activity instruction to help decrease symptoms of SOB with activities of daily living.   Expected Outcomes Short Term: Achieves a reduction of symptoms when performing activities of daily living.   Diabetes Yes  Recent HgbA1c less than 6%   Intervention Provide education about signs/symptoms and action to take for hypo/hyperglycemia.;Provide education about proper nutrition, including hydration, and aerobic/resistive exercise prescription along with prescribed medications to achieve blood glucose in normal ranges: Fasting glucose 65-99 mg/dL   Expected Outcomes Short Term: Participant verbalizes understanding of the signs/symptoms and immediate care of hyper/hypoglycemia, proper foot care and importance of medication, aerobic/resistive exercise and nutrition plan for blood  glucose control.;Long Term: Attainment of HbA1C < 7%.   Heart Failure Yes   Intervention Provide a combined exercise and nutrition program that is supplemented with education, support and counseling about heart failure. Directed toward relieving symptoms such as shortness of breath, decreased exercise tolerance, and extremity edema.   Expected Outcomes Improve functional capacity of life;Short term: Attendance in program 2-3 days a week with increased exercise capacity. Reported lower sodium intake. Reported increased fruit and vegetable intake. Reports medication compliance.;Short term: Daily weights obtained and reported for increase. Utilizing diuretic protocols set by physician.;Long term: Adoption of self-care skills and reduction of barriers for early signs and symptoms recognition and intervention leading to self-care maintenance.   Hypertension Yes   Intervention Provide education on lifestyle modifcations including regular physical  activity/exercise, weight management, moderate sodium restriction and increased consumption of fresh fruit, vegetables, and low fat dairy, alcohol moderation, and smoking cessation.;Monitor prescription use compliance.   Expected Outcomes Short Term: Continued assessment and intervention until BP is < 140/76m HG in hypertensive participants. < 130/825mHG in hypertensive participants with diabetes, heart failure or chronic kidney disease.;Long Term: Maintenance of blood pressure at goal levels.   Lipids Yes   Intervention Provide education and support for participant on nutrition & aerobic/resistive exercise along with prescribed medications to achieve LDL <7082mHDL >88m87m Expected Outcomes Short Term: Participant states understanding of desired cholesterol values and is compliant with medications prescribed. Participant is following exercise prescription and nutrition guidelines.;Long Term: Cholesterol controlled with medications as prescribed, with individualized exercise RX and with personalized nutrition plan. Value goals: LDL < 70mg72mL > 40 mg.      Core Components/Risk Factors/Patient Goals Review:    Core Components/Risk Factors/Patient Goals at Discharge (Final Review):    ITP Comments:     ITP Comments    Row Name 11/18/16 1305 12/08/16 0630         ITP Comments Medical Review completed today.  INitial ITP created and sent to Dr MIlleSabra Heck cosign, review and changes as needed.  Documentation of diagnosis can be found in [facility]24/2018 office visit. 30 day Review. Continue with ITP unless directed changes per Medical Director Review.          Comments:

## 2016-12-10 ENCOUNTER — Encounter: Payer: PPO | Admitting: *Deleted

## 2016-12-10 DIAGNOSIS — I5022 Chronic systolic (congestive) heart failure: Secondary | ICD-10-CM | POA: Diagnosis not present

## 2016-12-10 NOTE — Progress Notes (Signed)
Daily Session Note  Patient Details  Name: William Snyder MRN: [id number] Date of Birth: 1940/09/10 Referring Provider:     Cardiac Rehab from 11/18/2016 in [facility] Cardiac and Pulmonary Rehab  Referring Provider  Sabra Heck      Encounter Date: 12/10/2016  Check In:     Session Check In - 12/10/16 0808      Check-In   Location [facility]-Cardiac & Pulmonary Rehab   Staff Present Gerlene Burdock, RN, BSN;Katherinne Mofield Luan Pulling, MA, ACSM RCEP, Exercise Physiologist;Amanda Oletta Darter, BA, ACSM CEP, Exercise Physiologist   Supervising physician immediately available to respond to emergencies See telemetry face sheet for immediately available ER MD   Medication changes reported     No   Fall or balance concerns reported    No   Warm-up and Cool-down Performed on first and last piece of equipment   Resistance Training Performed Yes   VAD Patient? No     Pain Assessment   Currently in Pain? No/denies   Multiple Pain Sites No         History  Smoking Status  . Never Smoker  Smokeless Tobacco  . Never Used    Goals Met:  Independence with exercise equipment Exercise tolerated well No report of cardiac concerns or symptoms Strength training completed today  Goals Unmet:  Not Applicable  Comments: Pt able to follow exercise prescription today without complaint.  Will continue to monitor for progression.    Dr. Emily Filbert is Medical Director for [place] and [facility].

## 2016-12-13 ENCOUNTER — Encounter: Payer: PPO | Admitting: *Deleted

## 2016-12-13 DIAGNOSIS — I5022 Chronic systolic (congestive) heart failure: Secondary | ICD-10-CM | POA: Diagnosis not present

## 2016-12-13 NOTE — Progress Notes (Signed)
Daily Session Note  Patient Details  Name: William Snyder MRN: [id number] Date of Birth: 10/05/1940 Referring Provider:     Cardiac Rehab from 11/18/2016 in [facility] Cardiac and Pulmonary Rehab  Referring Provider  Miller      Encounter Date: 12/13/2016  Check In:     Session Check In - 12/13/16 0840      Check-In   Location [facility]-Cardiac & Pulmonary Rehab   Staff Present Kelly Hayes, BS, ACSM CEP, Exercise Physiologist;Carroll Enterkin, RN, BSN;Laureen Brown, BS, RRT, Respiratory Therapist   Supervising physician immediately available to respond to emergencies See telemetry face sheet for immediately available ER MD   Medication changes reported     No   Fall or balance concerns reported    No   Warm-up and Cool-down Performed on first and last piece of equipment   Resistance Training Performed Yes   VAD Patient? No     Pain Assessment   Currently in Pain? No/denies   Multiple Pain Sites No         History  Smoking Status  . Never Smoker  Smokeless Tobacco  . Never Used    Goals Met:  Independence with exercise equipment Exercise tolerated well No report of cardiac concerns or symptoms Strength training completed today  Goals Unmet:  Not Applicable  Comments: Pt able to follow exercise prescription today without complaint.  Will continue to monitor for progression.    Dr. Mark Miller is Medical Director for [facility] and [facility]. 

## 2016-12-15 DIAGNOSIS — I5022 Chronic systolic (congestive) heart failure: Secondary | ICD-10-CM

## 2016-12-15 NOTE — Progress Notes (Signed)
Daily Session Note  Patient Details  Name: William Snyder MRN: [id number] Date of Birth: 07-15-1940 Referring Provider:     Cardiac Rehab from 11/18/2016 in [facility] Cardiac and Pulmonary Rehab  Referring Provider  Sabra Heck      Encounter Date: 12/15/2016  Check In:     Session Check In - 12/15/16 0736      Check-In   Location [facility]-Cardiac & Pulmonary Rehab   Staff Present Nada Maclachlan, BA, ACSM CEP, Exercise Physiologist;Krista Frederico Hamman, RN BSN;Talynn Lebon Flavia Shipper   Supervising physician immediately available to respond to emergencies See telemetry face sheet for immediately available ER MD   Medication changes reported     No   Fall or balance concerns reported    No   Warm-up and Cool-down Performed as group-led instruction   Resistance Training Performed Yes   VAD Patient? No     Pain Assessment   Currently in Pain? No/denies   Multiple Pain Sites No         History  Smoking Status  . Never Smoker  Smokeless Tobacco  . Never Used    Goals Met:  Independence with exercise equipment Exercise tolerated well No report of cardiac concerns or symptoms Strength training completed today  Goals Unmet:  Not Applicable  Comments: Pt able to follow exercise prescription today without complaint.  Will continue to monitor for progression.   Dr. Emily Filbert is Medical Director for [place] and [facility].

## 2016-12-20 ENCOUNTER — Encounter: Payer: PPO | Admitting: *Deleted

## 2016-12-20 DIAGNOSIS — I5022 Chronic systolic (congestive) heart failure: Secondary | ICD-10-CM | POA: Diagnosis not present

## 2016-12-20 NOTE — Progress Notes (Signed)
Daily Session Note  Patient Details  Name: William Snyder MRN: [id number] Date of Birth: 1940/08/04 Referring Provider:     Cardiac Rehab from 11/18/2016 in [facility] Cardiac and Pulmonary Rehab  Referring Provider  Sabra Heck      Encounter Date: 12/20/2016  Check In:     Session Check In - 12/20/16 0818      Check-In   Location [facility]-Cardiac & Pulmonary Rehab   Staff Present Gerlene Burdock, RN, Levie Heritage, MA, ACSM RCEP, Exercise Physiologist;Kelly Amedeo Plenty, BS, ACSM CEP, Exercise Physiologist   Supervising physician immediately available to respond to emergencies See telemetry face sheet for immediately available ER MD   Medication changes reported     No   Fall or balance concerns reported    No   Warm-up and Cool-down Performed on first and last piece of equipment   Resistance Training Performed Yes   VAD Patient? No     Pain Assessment   Currently in Pain? No/denies   Multiple Pain Sites No         History  Smoking Status  . Never Smoker  Smokeless Tobacco  . Never Used    Goals Met:  Independence with exercise equipment Exercise tolerated well Personal goals reviewed No report of cardiac concerns or symptoms Strength training completed today  Goals Unmet:  Not Applicable  Comments: Pt able to follow exercise prescription today without complaint.  Will continue to monitor for progression. See ITP for goal review   Dr. Emily Filbert is Medical Director for [place] and [facility].

## 2016-12-22 DIAGNOSIS — I5022 Chronic systolic (congestive) heart failure: Secondary | ICD-10-CM | POA: Diagnosis not present

## 2016-12-22 NOTE — Progress Notes (Signed)
Daily Session Note  Patient Details  Name: William Snyder MRN: [id number] Date of Birth: 1940-12-14 Referring Provider:     Cardiac Rehab from 11/18/2016 in [facility] Cardiac and Pulmonary Rehab  Referring Provider  Sabra Heck      Encounter Date: 12/22/2016  Check In:     Session Check In - 12/22/16 0731      Check-In   Location [facility]-Cardiac & Pulmonary Rehab   Staff Present Alberteen Sam, MA, ACSM RCEP, Exercise Physiologist;Susanne Bice, RN, BSN, CCRP;Lilburn Straw Flavia Shipper   Supervising physician immediately available to respond to emergencies See telemetry face sheet for immediately available ER MD   Medication changes reported     No   Fall or balance concerns reported    No   Warm-up and Cool-down Performed on first and last piece of equipment   Resistance Training Performed Yes   VAD Patient? No     Pain Assessment   Currently in Pain? No/denies   Multiple Pain Sites No         History  Smoking Status  . Never Smoker  Smokeless Tobacco  . Never Used    Goals Met:  Independence with exercise equipment Exercise tolerated well No report of cardiac concerns or symptoms Strength training completed today  Goals Unmet:  Not Applicable  Comments: Pt able to follow exercise prescription today without complaint.  Will continue to monitor for progression.   Dr. Emily Filbert is Medical Director for [place] and [facility].

## 2016-12-24 DIAGNOSIS — I5022 Chronic systolic (congestive) heart failure: Secondary | ICD-10-CM | POA: Diagnosis not present

## 2016-12-24 DIAGNOSIS — I428 Other cardiomyopathies: Secondary | ICD-10-CM | POA: Diagnosis not present

## 2016-12-24 DIAGNOSIS — I509 Heart failure, unspecified: Secondary | ICD-10-CM | POA: Diagnosis not present

## 2016-12-27 ENCOUNTER — Encounter: Payer: PPO | Admitting: *Deleted

## 2016-12-27 DIAGNOSIS — I5022 Chronic systolic (congestive) heart failure: Secondary | ICD-10-CM | POA: Diagnosis not present

## 2016-12-27 NOTE — Progress Notes (Deleted)
Daily Session Note  Patient Details  Name: William Snyder MRN: [id number] Date of Birth: [birth year] Referring Provider:     Cardiac Rehab from 11/18/2016 in [facility] Cardiac and Pulmonary Rehab  Referring Provider  Sabra Heck      Encounter Date: 12/27/2016  Check In:     Session Check In - 12/27/16 0759      Check-In   Location [facility]-Cardiac & Pulmonary Rehab   Staff Present Earlean Shawl, BS, ACSM CEP, Exercise Physiologist;Carroll Enterkin, RN, Levie Heritage, MA, ACSM RCEP, Exercise Physiologist   Supervising physician immediately available to respond to emergencies See telemetry face sheet for immediately available ER MD   Medication changes reported     No   Fall or balance concerns reported    No   Warm-up and Cool-down Performed on first and last piece of equipment   Resistance Training Performed Yes   VAD Patient? No     Pain Assessment   Currently in Pain? No/denies   Multiple Pain Sites No         History  Smoking Status  . Never Smoker  Smokeless Tobacco  . Never Used    Goals Met:  {[facility] AMB CP REHAB GOALS MET:2257381404}  Goals Unmet:  {[facility] AMB CP REHAB GOALS NMMGY:[id number]}  Comments: ***   Dr. Emily Filbert is Medical Director for [place] and [facility].

## 2016-12-27 NOTE — Progress Notes (Signed)
Daily Session Note  Patient Details  Name: William Snyder MRN: [id number] Date of Birth: [birth year] Referring Provider:     Cardiac Rehab from 11/18/2016 in [facility] Cardiac and Pulmonary Rehab  Referring Provider  Sabra Heck      Encounter Date: 12/27/2016  Check In:     Session Check In - 12/27/16 0759      Check-In   Location [facility]-Cardiac & Pulmonary Rehab   Staff Present Earlean Shawl, BS, ACSM CEP, Exercise Physiologist;Carroll Enterkin, RN, Levie Heritage, MA, ACSM RCEP, Exercise Physiologist   Supervising physician immediately available to respond to emergencies See telemetry face sheet for immediately available ER MD   Medication changes reported     No   Fall or balance concerns reported    No   Warm-up and Cool-down Performed on first and last piece of equipment   Resistance Training Performed Yes   VAD Patient? No     Pain Assessment   Currently in Pain? No/denies   Multiple Pain Sites No         History  Smoking Status  . Never Smoker  Smokeless Tobacco  . Never Used    Goals Met:  Independence with exercise equipment Exercise tolerated well No report of cardiac concerns or symptoms Strength training completed today  Goals Unmet:  Not Applicable  Comments: Pt able to follow exercise prescription today without complaint.  Will continue to monitor for progression.    Dr. Emily Filbert is Medical Director for [place] and [facility].

## 2016-12-29 DIAGNOSIS — I5022 Chronic systolic (congestive) heart failure: Secondary | ICD-10-CM

## 2016-12-29 NOTE — Progress Notes (Signed)
Daily Session Note  Patient Details  Name: BACH ROCCHI MRN: [id number] Date of Birth: 1941-02-05 Referring Provider:     Cardiac Rehab from 11/18/2016 in [facility] Cardiac and Pulmonary Rehab  Referring Provider  Sabra Heck      Encounter Date: 12/29/2016  Check In:     Session Check In - 12/29/16 0758      Check-In   Location [facility]-Cardiac & Pulmonary Rehab   Staff Present Gerlene Burdock, RN, Levie Heritage, MA, ACSM RCEP, Exercise Physiologist;Maxamus Colao Flavia Shipper   Supervising physician immediately available to respond to emergencies See telemetry face sheet for immediately available ER MD   Medication changes reported     No   Fall or balance concerns reported    No   Warm-up and Cool-down Performed on first and last piece of equipment   Resistance Training Performed Yes   VAD Patient? No     Pain Assessment   Currently in Pain? No/denies         History  Smoking Status  . Never Smoker  Smokeless Tobacco  . Never Used    Goals Met:  Independence with exercise equipment Exercise tolerated well No report of cardiac concerns or symptoms Strength training completed today  Goals Unmet:  Not Applicable  Comments: Pt able to follow exercise prescription today without complaint.  Will continue to monitor for progression.   Dr. Emily Filbert is Medical Director for [place] and [facility].

## 2016-12-31 ENCOUNTER — Encounter: Payer: PPO | Attending: Internal Medicine

## 2016-12-31 DIAGNOSIS — I5022 Chronic systolic (congestive) heart failure: Secondary | ICD-10-CM | POA: Diagnosis not present

## 2016-12-31 DIAGNOSIS — I429 Cardiomyopathy, unspecified: Secondary | ICD-10-CM | POA: Diagnosis not present

## 2016-12-31 NOTE — Progress Notes (Signed)
Daily Session Note  Patient Details  Name: William Snyder MRN: [id number] Date of Birth: 05/12/1940 Referring Provider:     Cardiac Rehab from 11/18/2016 in [facility] Cardiac and Pulmonary Rehab  Referring Provider  Miller      Encounter Date: 12/31/2016  Check In:     Session Check In - 12/31/16 0814      Check-In   Location [facility]-Cardiac & Pulmonary Rehab   Staff Present Jessica Hawkins, MA, ACSM RCEP, Exercise Physiologist;Amanda Sommer, BA, ACSM CEP, Exercise Physiologist;Susanne Bice, RN, BSN, CCRP   Supervising physician immediately available to respond to emergencies See telemetry face sheet for immediately available ER MD   Medication changes reported     No   Fall or balance concerns reported    No   Warm-up and Cool-down Performed on first and last piece of equipment   Resistance Training Performed Yes   VAD Patient? No     Pain Assessment   Currently in Pain? No/denies   Multiple Pain Sites No         History  Smoking Status  . Never Smoker  Smokeless Tobacco  . Never Used    Goals Met:  Independence with exercise equipment Exercise tolerated well No report of cardiac concerns or symptoms Strength training completed today  Goals Unmet:  Not Applicable  Comments: Pt able to follow exercise prescription today without complaint.  Will continue to monitor for progression.    Dr. Mark Miller is Medical Director for [facility] and [facility]. 

## 2017-01-03 ENCOUNTER — Encounter: Payer: PPO | Admitting: *Deleted

## 2017-01-03 DIAGNOSIS — I5022 Chronic systolic (congestive) heart failure: Secondary | ICD-10-CM | POA: Diagnosis not present

## 2017-01-03 NOTE — Progress Notes (Signed)
Daily Session Note  Patient Details  Name: William Snyder MRN: [id number] Date of Birth: 1940/08/12 Referring Provider:     Cardiac Rehab from 11/18/2016 in [facility] Cardiac and Pulmonary Rehab  Referring Provider  Sabra Heck      Encounter Date: 01/03/2017  Check In: Session Check In - 01/03/17 0742      Check-In   Location  [facility]-Cardiac & Pulmonary Rehab    Staff Present  Darel Hong, RN Moises Blood, BS, ACSM CEP, Exercise Physiologist;Giannah Zavadil Luan Pulling, [place], ACSM RCEP, Exercise Physiologist    Supervising physician immediately available to respond to emergencies  See telemetry face sheet for immediately available ER MD    Medication changes reported      No    Fall or balance concerns reported     No    Warm-up and Cool-down  Performed on first and last piece of equipment    Resistance Training Performed  Yes    VAD Patient?  No      Pain Assessment   Currently in Pain?  No/denies          Social History   Tobacco Use  Smoking Status Never Smoker  Smokeless Tobacco Never Used    Goals Met:  Independence with exercise equipment Exercise tolerated well No report of cardiac concerns or symptoms Strength training completed today  Goals Unmet:  Not Applicable  Comments: Pt able to follow exercise prescription today without complaint.  Will continue to monitor for progression.    Dr. Emily Filbert is Medical Director for [place] and [facility].

## 2017-01-05 ENCOUNTER — Encounter: Payer: Self-pay | Admitting: *Deleted

## 2017-01-05 DIAGNOSIS — I5022 Chronic systolic (congestive) heart failure: Secondary | ICD-10-CM

## 2017-01-05 NOTE — Progress Notes (Signed)
Daily Session Note  Patient Details  Name: DADE RODIN MRN: [id number] Date of Birth: 09-29-1940 Referring Provider:     Cardiac Rehab from 11/18/2016 in [facility] Cardiac and Pulmonary Rehab  Referring Provider  Sabra Heck      Encounter Date: 01/05/2017  Check In: Session Check In - 01/05/17 0726      Check-In   Location  [facility]-Cardiac & Pulmonary Rehab    Staff Present  Justin Mend RCP,RRT,BSRT;Heath Lark, RN, BSN, CCRP;Jessica Luan Pulling, MA, ACSM RCEP, Exercise Physiologist    Supervising physician immediately available to respond to emergencies  See telemetry face sheet for immediately available ER MD    Medication changes reported      No    Fall or balance concerns reported     No    Warm-up and Cool-down  Performed on first and last piece of equipment    Resistance Training Performed  Yes    VAD Patient?  No      Pain Assessment   Currently in Pain?  No/denies          Social History   Tobacco Use  Smoking Status Never Smoker  Smokeless Tobacco Never Used    Goals Met:  Independence with exercise equipment Exercise tolerated well No report of cardiac concerns or symptoms Strength training completed today  Goals Unmet:  Not Applicable  Comments: Pt able to follow exercise prescription today without complaint.  Will continue to monitor for progression.   Dr. Emily Filbert is Medical Director for [place] and [facility].

## 2017-01-05 NOTE — Progress Notes (Signed)
Cardiac Individual Treatment Plan  Patient Details  Name: William Snyder MRN: [id number] Date of Birth: 12-03-1940 Referring Provider:     Cardiac Rehab from 11/18/2016 in [facility] Cardiac and Pulmonary Rehab  Referring Provider  Sabra Heck      Initial Encounter Date:    Cardiac Rehab from 11/18/2016 in [facility] Cardiac and Pulmonary Rehab  Date  11/18/16  Referring Provider  Sabra Heck      Visit Diagnosis: Heart failure, chronic systolic ([place])  Patient's Home Medications on Admission:  Current Outpatient Medications:  .  aspirin 81 MG tablet, Take 81 mg by mouth daily., Disp: , Rfl:  .  furosemide (LASIX) 20 MG tablet, Take 20 mg by mouth 2 (two) times daily., Disp: , Rfl:  .  glimepiride (AMARYL) 4 MG tablet, Take 4 mg by mouth daily., Disp: , Rfl:  .  magnesium oxide (MAG-OX) 400 MG tablet, Take by mouth., Disp: , Rfl:  .  metFORMIN (GLUCOPHAGE) 500 MG tablet, Take by mouth., Disp: , Rfl:  .  metoprolol tartrate (LOPRESSOR) 25 MG tablet, Take by mouth., Disp: , Rfl:  .  potassium chloride (K-DUR) 10 MEQ tablet, Take 10 mEq by mouth 2 (two) times daily., Disp: , Rfl:  .  sacubitril-valsartan (ENTRESTO) 24-26 MG, Take by mouth., Disp: , Rfl:  .  simvastatin (ZOCOR) 40 MG tablet, Take 40 mg by mouth daily., Disp: , Rfl:  .  sitaGLIPtin (JANUVIA) 50 MG tablet, Take by mouth., Disp: , Rfl:  .  spironolactone (ALDACTONE) 25 MG tablet, Take 25 mg by mouth daily., Disp: , Rfl:  No current facility-administered medications for this visit.   Facility-Administered Medications Ordered in Other Visits:  .  0.9 %  sodium chloride infusion, 250 mL, Intravenous, PRN, Teodoro Spray, MD .  0.9 %  sodium chloride infusion, , Intravenous, Continuous, Fath, Javier Docker, MD .  sodium chloride flush (NS) 0.9 % injection 3 mL, 3 mL, Intravenous, Q12H, Fath, Kenneth A, MD .  sodium chloride flush (NS) 0.9 % injection 3 mL, 3 mL, Intravenous, PRN, Ubaldo Glassing Javier Docker, MD  Past Medical History: Past Medical History:   Diagnosis Date  . Diabetes ([place])    Takes Metformin, Glipizide and Januvia  . High cholesterol   . Hypertension   . Prostate cancer [facility]) 2006   Prostatectomy.    Tobacco Use: Social History   Tobacco Use  Smoking Status Never Smoker  Smokeless Tobacco Never Used    Labs: Recent Review Flowsheet Data    There is no flowsheet data to display.       Exercise Target Goals:    Exercise Program Goal: Individual exercise prescription set with THRR, safety & activity barriers. Participant demonstrates ability to understand and report RPE using BORG scale, to self-measure pulse accurately, and to acknowledge the importance of the exercise prescription.  Exercise Prescription Goal: Starting with aerobic activity 30 plus minutes a day, 3 days per week for initial exercise prescription. Provide home exercise prescription and guidelines that participant acknowledges understanding prior to discharge.  Activity Barriers & Risk Stratification: Activity Barriers & Cardiac Risk Stratification - 11/18/16 1312      Activity Barriers & Cardiac Risk Stratification   Activity Barriers  Shortness of Breath;Other (comment)    Comments  fatigue    Cardiac Risk Stratification  High       6 Minute Walk: 6 Minute Walk    Row Name 11/18/16 1435         6 Minute Walk  Distance  1360 feet     Walk Time  6 minutes     # of Rest Breaks  0     MPH  2.58     METS  2.99     RPE  15     VO2 Peak  10.5     Symptoms  No     Resting HR  65 bpm     Resting BP  132/54     Resting Oxygen Saturation   100 %     Exercise Oxygen Saturation  during 6 min walk  99 %     Max Ex. HR  102 bpm     Max Ex. BP  110/56        Oxygen Initial Assessment:   Oxygen Re-Evaluation:   Oxygen Discharge (Final Oxygen Re-Evaluation):   Initial Exercise Prescription: Initial Exercise Prescription - 11/18/16 1400      Date of Initial Exercise RX and Referring Provider   Date  11/18/16    Referring  Provider  Sabra Heck      Treadmill   MPH  2.7    Grade  2.5    Minutes  15    METs  4      Recumbant Bike   Level  5    RPM  60    Watts  45    Minutes  15    METs  3.9      NuStep   Level  4    SPM  80    Minutes  15    METs  4      REL-XR   Level  3    Speed  50    Minutes  15    METs  4      Prescription Details   Frequency (times per week)  3    Duration  Progress to 45 minutes of aerobic exercise without signs/symptoms of physical distress      Intensity   THRR 40-80% of Max Heartrate  96-128    Ratings of Perceived Exertion  11-13    Perceived Dyspnea  0-4      Resistance Training   Training Prescription  Yes    Weight  3    Reps  10-15       Perform Capillary Blood Glucose checks as needed.  Exercise Prescription Changes: Exercise Prescription Changes    Row Name 11/29/16 0800 12/02/16 1400 12/16/16 1300 12/29/16 1500       Response to Exercise   Blood Pressure (Admit)  -  134/74  106/54  100/56    Blood Pressure (Exercise)  -  146/70  124/58  110/58    Blood Pressure (Exit)  -  90/50  90/48  122/69    Heart Rate (Admit)  -  63 bpm  66 bpm  60 bpm    Heart Rate (Exercise)  -  116 bpm  111 bpm  120 bpm    Heart Rate (Exit)  -  77 bpm  65 bpm  70 bpm    Rating of Perceived Exertion (Exercise)  -  13  13  13     Symptoms  -  none  none  none    Duration  -  Progress to 45 minutes of aerobic exercise without signs/symptoms of physical distress  Continue with 45 min of aerobic exercise without signs/symptoms of physical distress.  Continue with 45 min of aerobic exercise without signs/symptoms of physical distress.  Intensity  -  THRR unchanged  THRR unchanged  THRR unchanged      Progression   Progression  -  Continue to progress workloads to maintain intensity without signs/symptoms of physical distress.  Continue to progress workloads to maintain intensity without signs/symptoms of physical distress.  Continue to progress workloads to maintain  intensity without signs/symptoms of physical distress.    Average METs  -  3.99  4.6  4.47      Resistance Training   Training Prescription  -  Yes  Yes  Yes    Weight  -  3 lbs  3 lb  4 lbs    Reps  -  10-15  10-15  10-15      Interval Training   Interval Training  -  No  No  No      Treadmill   MPH  -  3.5  3.5  3.5    Grade  -  3  3  3     Minutes  -  15  15  15     METs  -  5.13  5.13  5.13      Recumbant Bike   Level  -  5  -  5    Watts  -  25  -  -    Minutes  -  15  -  15    METs  -  3.21  -  4.1      REL-XR   Level  -  3  4  4     Minutes  -  15  15  15     METs  -  3.8  4.2  5      Home Exercise Plan   Plans to continue exercise at  [employer] (comment) walking, [employer] (comment) walking, [employer] (comment) walking, [employer] (comment) walking, [employer]    Frequency  Add 2 additional days to program exercise sessions.  Add 2 additional days to program exercise sessions.  Add 2 additional days to program exercise sessions.  Add 2 additional days to program exercise sessions.    Initial Home Exercises Provided  11/29/16  11/29/16  11/29/16  11/29/16       Exercise Comments: Exercise Comments    Row Name 11/22/16 [phone]           Exercise Comments   First full day of exercise!  Patient was oriented to gym and equipment including functions, settings, policies, and procedures.  Patient's individual exercise prescription and treatment plan were reviewed.  All starting workloads were established based on the results of the 6 minute walk test done at initial orientation visit.  The plan for exercise progression was also introduced and progression will be customized based on patient's performance and goals.          Exercise Goals and Review: Exercise Goals    Row Name 11/18/16 1434             Exercise Goals   Increase Physical Activity  Yes       Intervention  Provide  advice, education, support and counseling about physical activity/exercise needs.;Develop an individualized exercise prescription for aerobic and resistive training based on initial evaluation findings, risk stratification, comorbidities and participant's personal goals.       Expected Outcomes  Achievement of increased cardiorespiratory fitness and enhanced flexibility, muscular endurance and strength shown through measurements of functional capacity and personal  statement of participant.       Increase Strength and Stamina  Yes       Intervention  Provide advice, education, support and counseling about physical activity/exercise needs.;Develop an individualized exercise prescription for aerobic and resistive training based on initial evaluation findings, risk stratification, comorbidities and participant's personal goals.       Expected Outcomes  Achievement of increased cardiorespiratory fitness and enhanced flexibility, muscular endurance and strength shown through measurements of functional capacity and personal statement of participant.       Able to understand and use rate of perceived exertion (RPE) scale  Yes       Intervention  Provide education and explanation on how to use RPE scale       Expected Outcomes  Short Term: Able to use RPE daily in rehab to express subjective intensity level;Long Term:  Able to use RPE to guide intensity level when exercising independently       Able to understand and use Dyspnea scale  Yes       Intervention  Provide education and explanation on how to use Dyspnea scale       Expected Outcomes  Short Term: Able to use Dyspnea scale daily in rehab to express subjective sense of shortness of breath during exertion;Long Term: Able to use Dyspnea scale to guide intensity level when exercising independently       Knowledge and understanding of Target Heart Rate Range (THRR)  Yes       Intervention  Provide education and explanation of THRR including how the numbers  were predicted and where they are located for reference       Expected Outcomes  Short Term: Able to state/look up THRR;Short Term: Able to use daily as guideline for intensity in rehab;Long Term: Able to use THRR to govern intensity when exercising independently       Able to check pulse independently  Yes       Intervention  Provide education and demonstration on how to check pulse in carotid and radial arteries.;Review the importance of being able to check your own pulse for safety during independent exercise       Expected Outcomes  Short Term: Able to explain why pulse checking is important during independent exercise;Long Term: Able to check pulse independently and accurately       Understanding of Exercise Prescription  Yes       Intervention  Provide education, explanation, and written materials on patient's individual exercise prescription       Expected Outcomes  Short Term: Able to explain program exercise prescription;Long Term: Able to explain home exercise prescription to exercise independently          Exercise Goals Re-Evaluation : Exercise Goals Re-Evaluation    Row Name 11/22/16 5183 11/29/16 0848 12/02/16 1441 12/16/16 1357 12/20/16 0805     Exercise Goal Re-Evaluation   Exercise Goals Review  Understanding of Exercise Prescription;Knowledge and understanding of Target Heart Rate Range (THRR);Able to understand and use rate of perceived exertion (RPE) scale  Increase Physical Activity;Knowledge and understanding of Target Heart Rate Range (THRR);Able to check pulse independently;Able to understand and use rate of perceived exertion (RPE) scale;Understanding of Exercise Prescription  Increase Physical Activity;Increase Strength and Stamina  Increase Physical Activity;Increase Strength and Stamina  Increase Physical Activity;Increase Strength and Stamina;Understanding of Exercise Prescription   Comments  Reviewed RPE scale, THR and program prescription with pt today.  Pt voiced  understanding and was given a copy of goals  to take home.   Reviewed home exercise with pt today.  Pt plans to continue walk and return to [employer] for exercise.  Reviewed THR, pulse, RPE, sign and symptoms, and when to call 911 or MD.  Also discussed weather considerations and indoor options.  Pt voiced understanding.  Brendyn is off to a good start in rehab.  He is already up to 3.5 mph on the treadmill.  He is feeling better and already returned to the gym.  We will continue to monitor his progression.   Izick is tolerating exercise well and has done 45 min continuous.    Kennard is doing well in rehab.  He feels that he is getting better and his strength and stamina are getting better.  He was able to walk to [place] this weekend without a problem.  He is going up to [employer] twice a week and walking on the treadmill. He does use the machines some.    Expected Outcomes  Short: Use RPE daily to regulate intensity.  Long: Follow program prescription in THR.  Short: He going to return to gym two days a week.  Long: Continue to exercise independently  Short: Reutrn to gym two days a week.  Long: Continue to build strength and stamina with exercise.   Short - Continue to build intensity.  Long - Maintain fitness level on his own.   Short: Discuss interval training.   Long: Continue to exercise independently.      Discharge Exercise Prescription (Final Exercise Prescription Changes): Exercise Prescription Changes - 12/29/16 1500      Response to Exercise   Blood Pressure (Admit)  100/56    Blood Pressure (Exercise)  110/58    Blood Pressure (Exit)  122/69    Heart Rate (Admit)  60 bpm    Heart Rate (Exercise)  120 bpm    Heart Rate (Exit)  70 bpm    Rating of Perceived Exertion (Exercise)  13    Symptoms  none    Duration  Continue with 45 min of aerobic exercise without signs/symptoms of physical distress.    Intensity  THRR unchanged      Progression    Progression  Continue to progress workloads to maintain intensity without signs/symptoms of physical distress.    Average METs  4.47      Resistance Training   Training Prescription  Yes    Weight  4 lbs    Reps  10-15      Interval Training   Interval Training  No      Treadmill   MPH  3.5    Grade  3    Minutes  15    METs  5.13      Recumbant Bike   Level  5    Minutes  15    METs  4.1      REL-XR   Level  4    Minutes  15    METs  5      Home Exercise Plan   Plans to continue exercise at  [employer] (comment) walking, [employer]   walking, [employer]   Frequency  Add 2 additional days to program exercise sessions.    Initial Home Exercises Provided  11/29/16       Nutrition:  Target Goals: Understanding of nutrition guidelines, daily intake of sodium <157m, cholesterol <2063m calories 30% from fat and 7% or less from saturated fats, daily to have  5 or more servings of fruits and vegetables.  Biometrics: Pre Biometrics - 11/18/16 1434      Pre Biometrics   Height  5' 10"  (1.778 m)    Weight  164 lb 8 oz (74.6 kg)    Waist Circumference  38 inches    Hip Circumference  42 inches    Waist to Hip Ratio  0.9 %    BMI (Calculated)  23.6    Single Leg Stand  30 seconds        Nutrition Therapy Plan and Nutrition Goals: Nutrition Therapy & Goals - 12/20/16 0811      Nutrition Therapy   RD appointment defered  Yes       Nutrition Discharge: Rate Your Plate Scores: Nutrition Assessments - 11/18/16 1312      MEDFICTS Scores   Pre Score  80       Nutrition Goals Re-Evaluation: Nutrition Goals Re-Evaluation    Row Name 12/20/16 0811             Goals   Current Weight  165 lb (74.8 kg)       Nutrition Goal  Watch what he eats and base diet off of what he has learned in class.       Comment  Gabriella does not want to meet with dietician.  He was able to gleam some insight from the education classes and plans to use that as  guideance.  He admits to being a picky eater and wanting to stick with what he knows he likes.        Expected Outcome  Short: Attend both education classes to help with diet.  Long: Continue to maintain heart healthy diet.           Nutrition Goals Discharge (Final Nutrition Goals Re-Evaluation): Nutrition Goals Re-Evaluation - 12/20/16 0811      Goals   Current Weight  165 lb (74.8 kg)    Nutrition Goal  Watch what he eats and base diet off of what he has learned in class.    Comment  Nasiah does not want to meet with dietician.  He was able to gleam some insight from the education classes and plans to use that as guideance.  He admits to being a picky eater and wanting to stick with what he knows he likes.     Expected Outcome  Short: Attend both education classes to help with diet.  Long: Continue to maintain heart healthy diet.        Psychosocial: Target Goals: Acknowledge presence or absence of significant depression and/or stress, maximize coping skills, provide positive support system. Participant is able to verbalize types and ability to use techniques and skills needed for reducing stress and depression.   Initial Review & Psychosocial Screening: Initial Psych Review & Screening - 11/18/16 1314      Initial Review   Current issues with  None Identified      Family Dynamics   Good Support System?  Yes wife William Snyder   wife William Snyder     Barriers   Psychosocial barriers to participate in program  There are no identifiable barriers or psychosocial needs.;The patient should benefit from training in stress management and relaxation.      Screening Interventions   Interventions  Yes;Encouraged to exercise;To provide support and resources with identified psychosocial needs;Provide feedback about the scores to participant    Expected Outcomes  Short Term goal: Utilizing psychosocial counselor, staff and physician to assist with identification  of specific Stressors or current issues  interfering with healing process. Setting desired goal for each stressor or current issue identified.;Long Term Goal: Stressors or current issues are controlled or eliminated.;Short Term goal: Identification and review with participant of any Quality of Life or Depression concerns found by scoring the questionnaire.;Long Term goal: The participant improves quality of Life and PHQ9 Scores as seen by post scores and/or verbalization of changes       Quality of Life Scores:  Quality of Life - 11/18/16 1315      Quality of Life Scores   Health/Function Pre  22.93 %    Socioeconomic Pre  29.58 %    Psych/Spiritual Pre  28.79 %    Family Pre  26.4 %    GLOBAL Pre  25.91 %       PHQ-9: Recent Review Flowsheet Data    Depression screen [facility] 2/9 11/18/2016   Decreased Interest 0   Down, Depressed, Hopeless 0   PHQ - 2 Score 0   Altered sleeping 0   Tired, decreased energy 2   Change in appetite 0   Feeling bad or failure about yourself  0   Trouble concentrating 0   Moving slowly or fidgety/restless 0   Suicidal thoughts 0   PHQ-9 Score 2   Difficult doing work/chores Somewhat difficult      Interpretation of Total Score  Total Score Depression Severity:  1-4 = Minimal depression, 5-9 = Mild depression, 10-14 = Moderate depression, 15-19 = Moderately severe depression, 20-27 = Severe depression   Psychosocial Evaluation and Intervention: Psychosocial Evaluation - 11/22/16 0950      Psychosocial Evaluation & Interventions   Interventions  Encouraged to exercise with the program and follow exercise prescription    Comments  Counselor met with William Snyder [facility]) for initial psychosocial evaluation.  He is a 76 year old who had a defibrolator implanted in July and is ordered to this program.  Aidan has a strong support system with a spouse of 57 years; two daughters locally and active involvement in his local church.  Other than his heart issues, Jesua has diabetes as well.  He  sleeps well and has a good appetite.  Atlee denies a history of depression or anxiety or any current symptoms and reports his mood is generally positive most of the time.  He has goals to have more energy and feel strong enough to increase his activity back to 5x week at gym and volunteering 2-3x per week.  He will be followed by staff throughout the course of this program.      Expected Outcomes  Helix will benefit from consistent exercise to achieve his stated goals.  The educational and psychoeducational components of this program will be helpful as well in understanding and managing his heart disease.    Continue Psychosocial Services   Follow up required by staff       Psychosocial Re-Evaluation: Psychosocial Re-Evaluation    [place] Name 12/20/16 [phone]             Psychosocial Re-Evaluation   Current issues with  Current Stress Concerns       Comments  Gianfranco is doing better mentally.  He is feeling more confident and realizes that he has to cope with his disease and may not return to full function.  He is willing to maintain with what he has and make the best of it.  He is sleeping good and still has a great support system in  his wife.   And he also continues to stay active at church.  Khori feels like the program has really helped him to feel stronger and feel better overall.  At the beginning, he was not able to do much and now he doing more at home.  Also Leaf has found the education classes to be helpful as well.        Expected Outcomes  Short: Continue to work on buidling strength and stamina.  Long: Maintain postive attitude toward his disease.       Interventions  Stress management education;Encouraged to attend Cardiac Rehabilitation for the exercise       Continue Psychosocial Services   Follow up required by staff         Initial Review   Source of Stress Concerns  Chronic Illness          Psychosocial Discharge (Final Psychosocial Re-Evaluation): Psychosocial  Re-Evaluation - 12/20/16 0814      Psychosocial Re-Evaluation   Current issues with  Current Stress Concerns    Comments  Tracker is doing better mentally.  He is feeling more confident and realizes that he has to cope with his disease and may not return to full function.  He is willing to maintain with what he has and make the best of it.  He is sleeping good and still has a great support system in his wife.   And he also continues to stay active at church.  Hrithik feels like the program has really helped him to feel stronger and feel better overall.  At the beginning, he was not able to do much and now he doing more at home.  Also Generoso has found the education classes to be helpful as well.     Expected Outcomes  Short: Continue to work on buidling strength and stamina.  Long: Maintain postive attitude toward his disease.    Interventions  Stress management education;Encouraged to attend Cardiac Rehabilitation for the exercise    Continue Psychosocial Services   Follow up required by staff      Initial Review   Source of Stress Concerns  Chronic Illness       Vocational Rehabilitation: Provide vocational rehab assistance to qualifying candidates.   Vocational Rehab Evaluation & Intervention: Vocational Rehab - 11/18/16 1318      Initial Vocational Rehab Evaluation & Intervention   Assessment shows need for Vocational Rehabilitation  No       Education: Education Goals: Education classes will be provided on a variety of topics geared toward better understanding of heart health and risk factor modification. Participant will state understanding/return demonstration of topics presented as noted by education test scores.  Learning Barriers/Preferences: Learning Barriers/Preferences - 11/18/16 1317      Learning Barriers/Preferences   Learning Barriers  None    Learning Preferences  None       Education Topics: General Nutrition Guidelines/Fats and Fiber: -Group instruction  provided by verbal, written material, models and posters to present the general guidelines for heart healthy nutrition. Gives an explanation and review of dietary fats and fiber.   Cardiac Rehab from 01/03/2017 in [facility] Cardiac and Pulmonary Rehab  Date  12/13/16  Educator  CR  Instruction Review Code  1- Verbalizes Understanding      Controlling Sodium/Reading Food Labels: -Group verbal and written material supporting the discussion of sodium use in heart healthy nutrition. Review and explanation with models, verbal and written materials for utilization of the food label.  Cardiac Rehab from 01/03/2017 in [facility] Cardiac and Pulmonary Rehab  Date  12/20/16  Educator  CR  Instruction Review Code  1- Verbalizes Understanding      Exercise Physiology & Risk Factors: - Group verbal and written instruction with models to review the exercise physiology of the cardiovascular system and associated critical values. Details cardiovascular disease risk factors and the goals associated with each risk factor.   Cardiac Rehab from 01/03/2017 in [facility] Cardiac and Pulmonary Rehab  Date  12/27/16  Educator  [facility]  Instruction Review Code  1- Verbalizes Understanding      Aerobic Exercise & Resistance Training: - Gives group verbal and written discussion on the health impact of inactivity. On the components of aerobic and resistive training programs and the benefits of this training and how to safely progress through these programs.   Cardiac Rehab from 01/03/2017 in [facility] Cardiac and Pulmonary Rehab  Date  12/29/16  Educator  [facility]  Instruction Review Code  1- Verbalizes Understanding      Flexibility, Balance, General Exercise Guidelines: - Provides group verbal and written instruction on the benefits of flexibility and balance training programs. Provides general exercise guidelines with specific guidelines to those with heart or lung disease. Demonstration and skill practice provided.   Cardiac Rehab from  01/03/2017 in [facility] Cardiac and Pulmonary Rehab  Date  01/03/17  Educator  [facility]  Instruction Review Code  1- Verbalizes Understanding      Stress Management: - Provides group verbal and written instruction about the health risks of elevated stress, cause of high stress, and healthy ways to reduce stress.   Depression: - Provides group verbal and written instruction on the correlation between heart/lung disease and depressed mood, treatment options, and the stigmas associated with seeking treatment.   Cardiac Rehab from 01/03/2017 in [facility] Cardiac and Pulmonary Rehab  Date  12/15/16  Educator  [facility]  Instruction Review Code  1- Verbalizes Understanding      Anatomy & Physiology of the Heart: - Group verbal and written instruction and models provide basic cardiac anatomy and physiology, with the coronary electrical and arterial systems. Review of: AMI, Angina, Valve disease, Heart Failure, Cardiac Arrhythmia, Pacemakers, and the ICD.   Cardiac Procedures: - Group verbal and written instruction to review commonly prescribed medications for heart disease. Reviews the medication, class of the drug, and side effects. Includes the steps to properly store meds and maintain the prescription regimen. (beta blockers and nitrates)   Cardiac Rehab from 01/03/2017 in [facility] Cardiac and Pulmonary Rehab  Date  11/22/16  Educator  [facility]  Instruction Review Code  2- Demonstrated Understanding      Cardiac Medications I: - Group verbal and written instruction to review commonly prescribed medications for heart disease. Reviews the medication, class of the drug, and side effects. Includes the steps to properly store meds and maintain the prescription regimen.   Cardiac Rehab from 01/03/2017 in [facility] Cardiac and Pulmonary Rehab  Date  11/29/16  Educator  [facility]  Instruction Review Code  1- Verbalizes Understanding      Cardiac Medications II: -Group verbal and written instruction to review commonly prescribed  medications for heart disease. Reviews the medication, class of the drug, and side effects. (all other drug classes)    Go Sex-Intimacy & Heart Disease, Get SMART - Goal Setting: - Group verbal and written instruction through game format to discuss heart disease and the return to sexual intimacy. Provides group verbal and written material to discuss and  apply goal setting through the application of the S.M.A.R.T. Method.   Cardiac Rehab from 01/03/2017 in [facility] Cardiac and Pulmonary Rehab  Date  11/22/16  Educator  [facility]  Instruction Review Code  2- Demonstrated Understanding      Other Matters of the Heart: - Provides group verbal, written materials and models to describe Heart Failure, Angina, Valve Disease, Peripheral Artery Disease, and Diabetes in the realm of heart disease. Includes description of the disease process and treatment options available to the cardiac patient.   Exercise & Equipment Safety: - Individual verbal instruction and demonstration of equipment use and safety with use of the equipment.   Cardiac Rehab from 01/03/2017 in [facility] Cardiac and Pulmonary Rehab  Date  11/18/16  Educator  [facility]  Instruction Review Code  1- Verbalizes Understanding      Infection Prevention: - Provides verbal and written material to individual with discussion of infection control including proper hand washing and proper equipment cleaning during exercise session.   Cardiac Rehab from 01/03/2017 in [facility] Cardiac and Pulmonary Rehab  Date  11/18/16  Educator  [facility]  Instruction Review Code  1- Verbalizes Understanding      Falls Prevention: - Provides verbal and written material to individual with discussion of falls prevention and safety.   Cardiac Rehab from 01/03/2017 in [facility] Cardiac and Pulmonary Rehab  Date  11/18/16  Educator  [facility]  Instruction Review Code  1- Verbalizes Understanding      Diabetes: - Individual verbal and written instruction to review signs/symptoms of diabetes,  desired ranges of glucose level fasting, after meals and with exercise. Acknowledge that pre and post exercise glucose checks will be done for 3 sessions at entry of program.   Cardiac Rehab from 01/03/2017 in [facility] Cardiac and Pulmonary Rehab  Date  11/18/16  Educator  [facility]  Instruction Review Code  1- Verbalizes Understanding      Other: -Provides group and verbal instruction on various topics (see comments)    Knowledge Questionnaire Score: Knowledge Questionnaire Score - 11/18/16 1317      Knowledge Questionnaire Score   Pre Score  22/28 Correct response were reviewed with Juanda Crumble. He veralized understanding of the correct responses and had no questions.   Correct response were reviewed with Juanda Crumble. He veralized understanding of the correct responses and had no questions.      Core Components/Risk Factors/Patient Goals at Admission: Personal Goals and Risk Factors at Admission - 11/18/16 1312      Core Components/Risk Factors/Patient Goals on Admission    Weight Management  Weight Loss;Yes    Intervention  Weight Management: Develop a combined nutrition and exercise program designed to reach desired caloric intake, while maintaining appropriate intake of nutrient and fiber, sodium and fats, and appropriate energy expenditure required for the weight goal.;Weight Management: Provide education and appropriate resources to help participant work on and attain dietary goals.;Weight Management/Obesity: Establish reasonable short term and long term weight goals.    Admit Weight  164 lb 8 oz (74.6 kg)    Goal Weight: Short Term  162 lb (73.5 kg)    Goal Weight: Long Term  150 lb (68 kg)    Expected Outcomes  Short Term: Continue to assess and modify interventions until short term weight is achieved;Weight Loss: Understanding of general recommendations for a balanced deficit meal plan, which promotes 1-2 lb weight loss per week and includes a negative energy balance of [phone] kcal/d     Improve shortness of breath with ADL's  Yes    Intervention  Provide education, individualized exercise plan and daily activity instruction to help decrease symptoms of SOB with activities of daily living.    Expected Outcomes  Short Term: Achieves a reduction of symptoms when performing activities of daily living.    Diabetes  Yes Recent HgbA1c less than 6%   Recent HgbA1c less than 6%   Intervention  Provide education about signs/symptoms and action to take for hypo/hyperglycemia.;Provide education about proper nutrition, including hydration, and aerobic/resistive exercise prescription along with prescribed medications to achieve blood glucose in normal ranges: Fasting glucose 65-99 mg/dL    Expected Outcomes  Short Term: Participant verbalizes understanding of the signs/symptoms and immediate care of hyper/hypoglycemia, proper foot care and importance of medication, aerobic/resistive exercise and nutrition plan for blood glucose control.;Long Term: Attainment of HbA1C < 7%.    Heart Failure  Yes    Intervention  Provide a combined exercise and nutrition program that is supplemented with education, support and counseling about heart failure. Directed toward relieving symptoms such as shortness of breath, decreased exercise tolerance, and extremity edema.    Expected Outcomes  Improve functional capacity of life;Short term: Attendance in program 2-3 days a week with increased exercise capacity. Reported lower sodium intake. Reported increased fruit and vegetable intake. Reports medication compliance.;Short term: Daily weights obtained and reported for increase. Utilizing diuretic protocols set by physician.;Long term: Adoption of self-care skills and reduction of barriers for early signs and symptoms recognition and intervention leading to self-care maintenance.    Hypertension  Yes    Intervention  Provide education on lifestyle modifcations including regular physical activity/exercise, weight  management, moderate sodium restriction and increased consumption of fresh fruit, vegetables, and low fat dairy, alcohol moderation, and smoking cessation.;Monitor prescription use compliance.    Expected Outcomes  Short Term: Continued assessment and intervention until BP is < 140/69m HG in hypertensive participants. < 130/829mHG in hypertensive participants with diabetes, heart failure or chronic kidney disease.;Long Term: Maintenance of blood pressure at goal levels.    Lipids  Yes    Intervention  Provide education and support for participant on nutrition & aerobic/resistive exercise along with prescribed medications to achieve LDL <7039mHDL >37m35m  Expected Outcomes  Short Term: Participant states understanding of desired cholesterol values and is compliant with medications prescribed. Participant is following exercise prescription and nutrition guidelines.;Long Term: Cholesterol controlled with medications as prescribed, with individualized exercise RX and with personalized nutrition plan. Value goals: LDL < 70mg1mL > 40 mg.       Core Components/Risk Factors/Patient Goals Review:  Goals and Risk Factor Review    Row Name 12/20/16 0808             Core Components/Risk Factors/Patient Goals Review   Personal Goals Review  Weight Management/Obesity;Lipids;Hypertension;Diabetes       Review  CharlKamareonoing well in rehab.  He is down a few a pounds at holding at 165 lbs.  Blood pressures and blood sugars have been good and he is not having any issues with his medications. He has not had any major symptoms with his heart failure.  On occasion he has been short of breath, but says that it is manageable by sitting down and take an extra fluid pill if bad enough.       Expected Outcomes  Short: Continue to work on weight loss.  Long: Continue to manage his heart failure.  Core Components/Risk Factors/Patient Goals at Discharge (Final Review):  Goals and Risk Factor Review -  12/20/16 0808      Core Components/Risk Factors/Patient Goals Review   Personal Goals Review  Weight Management/Obesity;Lipids;Hypertension;Diabetes    Review  Ramiz is doing well in rehab.  He is down a few a pounds at holding at 165 lbs.  Blood pressures and blood sugars have been good and he is not having any issues with his medications. He has not had any major symptoms with his heart failure.  On occasion he has been short of breath, but says that it is manageable by sitting down and take an extra fluid pill if bad enough.    Expected Outcomes  Short: Continue to work on weight loss.  Long: Continue to manage his heart failure.        ITP Comments: ITP Comments    Row Name 11/18/16 1305 12/08/16 0630 01/05/17 9800       ITP Comments  Medical Review completed today.  INitial ITP created and sent to Dr Sabra Heck for  cosign, review and changes as needed.  Documentation of diagnosis can be found in [facility]   07/22/2016 office visit.  30 day Review. Continue with ITP unless directed changes per Medical Director Review.   30 day review. Continue with ITP unless directed changes per Medical Director review.         Comments:

## 2017-01-10 ENCOUNTER — Encounter: Payer: PPO | Admitting: *Deleted

## 2017-01-10 VITALS — Ht 70.0 in | Wt 164.0 lb

## 2017-01-10 DIAGNOSIS — I5022 Chronic systolic (congestive) heart failure: Secondary | ICD-10-CM

## 2017-01-10 NOTE — Progress Notes (Signed)
Daily Session Note  Patient Details  Name: William Snyder MRN: [id number] Date of Birth: [birth year] Referring Provider:     Cardiac Rehab from 11/18/2016 in [facility] Cardiac and Pulmonary Rehab  Referring Provider  Sabra Heck      Encounter Date: 01/10/2017  Check In: Session Check In - 01/10/17 0752      Check-In   Location  [facility]-Cardiac & Pulmonary Rehab    Staff Present  Earlean Shawl, BS, ACSM CEP, Exercise Physiologist;Carroll Enterkin, RN, Levie Heritage, MA, ACSM RCEP, Exercise Physiologist    Supervising physician immediately available to respond to emergencies  See telemetry face sheet for immediately available ER MD    Medication changes reported      Yes    Comments  metroprolol changed to 100 mg extended release daily    Fall or balance concerns reported     No    Warm-up and Cool-down  Performed on first and last piece of equipment    Resistance Training Performed  Yes    VAD Patient?  No      Pain Assessment   Currently in Pain?  No/denies          Social History   Tobacco Use  Smoking Status Never Smoker  Smokeless Tobacco Never Used    Goals Met:  Independence with exercise equipment Exercise tolerated well No report of cardiac concerns or symptoms Strength training completed today  Goals Unmet:  Not Applicable  Comments: Pt able to follow exercise prescription today without complaint.  Will continue to monitor for progression.  [place] Name 11/18/16 1435 01/10/17 0853       6 Minute Walk   Phase  -  Discharge    Distance  1360 feet  1380 feet    Distance % Change  -  1.5 %    Distance Feet Change  -  20 ft    Walk Time  6 minutes  6 minutes    # of Rest Breaks  0  0    MPH  2.58  2.61    METS  2.99  2.87    RPE  15  12    VO2 Peak  10.5  10.03    Symptoms  No  No    Resting HR  65 bpm  60 bpm    Resting BP  132/54  128/64    Resting Oxygen Saturation   100 %  -    Exercise Oxygen Saturation  during 6 min walk  99 %  99 %     Max Ex. HR  102 bpm  86 bpm    Max Ex. BP  110/56  130/70          Dr. Emily Filbert is Medical Director for [facility] and [facility].

## 2017-01-12 DIAGNOSIS — I5022 Chronic systolic (congestive) heart failure: Secondary | ICD-10-CM

## 2017-01-12 NOTE — Progress Notes (Signed)
Daily Session Note  Patient Details  Name: William Snyder MRN: [id number] Date of Birth: 06/30/1940 Referring Provider:     Cardiac Rehab from 11/18/2016 in [facility] Cardiac and Pulmonary Rehab  Referring Provider  Sabra Heck      Encounter Date: 01/12/2017  Check In: Session Check In - 01/12/17 0741      Check-In   Location  [facility]-Cardiac & Pulmonary Rehab    Staff Present  Darel Hong, RN BSN;Maejor Erven Darrin Nipper, [place], ACSM RCEP, Exercise Physiologist    Supervising physician immediately available to respond to emergencies  See telemetry face sheet for immediately available ER MD    Medication changes reported      Yes    Fall or balance concerns reported     No    Warm-up and Cool-down  Performed on first and last piece of equipment    Resistance Training Performed  Yes    VAD Patient?  No      Pain Assessment   Currently in Pain?  No/denies        Exercise Prescription Changes - 01/11/17 1400      Response to Exercise   Blood Pressure (Admit)  128/64    Blood Pressure (Exercise)  130/60    Blood Pressure (Exit)  126/67    Heart Rate (Admit)  71 bpm    Heart Rate (Exercise)  118 bpm    Heart Rate (Exit)  72 bpm    Rating of Perceived Exertion (Exercise)  13    Symptoms  none    Duration  Continue with 45 min of aerobic exercise without signs/symptoms of physical distress.    Intensity  THRR unchanged      Progression   Progression  Continue to progress workloads to maintain intensity without signs/symptoms of physical distress.    Average METs  4.71      Resistance Training   Training Prescription  Yes    Weight  4 lbs    Reps  10-15      Interval Training   Interval Training  No      Treadmill   MPH  3.5    Grade  3    Minutes  15    METs  5.13      Recumbant Bike   Level  6    Watts  55    Minutes  15    METs  4.31      REL-XR   Level  4    Minutes  15    METs  4.7      Home Exercise Plan   Plans to continue exercise at   [employer] (comment) walking, [employer]    Frequency  Add 2 additional days to program exercise sessions.    Initial Home Exercises Provided  11/29/16       Social History   Tobacco Use  Smoking Status Never Smoker  Smokeless Tobacco Never Used    Goals Met:  Independence with exercise equipment Exercise tolerated well No report of cardiac concerns or symptoms Strength training completed today  Goals Unmet:  Not Applicable  Comments: Pt able to follow exercise prescription today without complaint.  Will continue to monitor for progression.   Dr. Emily Filbert is Medical Director for [place] and [facility].

## 2017-01-14 DIAGNOSIS — I5022 Chronic systolic (congestive) heart failure: Secondary | ICD-10-CM | POA: Diagnosis not present

## 2017-01-14 NOTE — Patient Instructions (Signed)
Discharge Instructions  Patient Details  Name: William Snyder MRN: [id number] Date of Birth: Jul 24, 1940 Referring Provider:  Rusty Aus, MD   Number of Visits: 36/36  Reason for Discharge:  Patient reached a stable level of exercise. Patient independent in their exercise. Patient has met program and personal goals.  Smoking History:  Social History   Tobacco Use  Smoking Status Never Smoker  Smokeless Tobacco Never Used    Diagnosis:  Heart failure, chronic systolic (HCC)  Initial Exercise Prescription: Initial Exercise Prescription - 11/18/16 1400      Date of Initial Exercise RX and Referring Provider   Date  11/18/16    Referring Provider  Sabra Heck      Treadmill   MPH  2.7    Grade  2.5    Minutes  15    METs  4      Recumbant Bike   Level  5    RPM  60    Watts  45    Minutes  15    METs  3.9      NuStep   Level  4    SPM  80    Minutes  15    METs  4      REL-XR   Level  3    Speed  50    Minutes  15    METs  4      Prescription Details   Frequency (times per week)  3    Duration  Progress to 45 minutes of aerobic exercise without signs/symptoms of physical distress      Intensity   THRR 40-80% of Max Heartrate  96-128    Ratings of Perceived Exertion  11-13    Perceived Dyspnea  0-4      Resistance Training   Training Prescription  Yes    Weight  3    Reps  10-15       Discharge Exercise Prescription (Final Exercise Prescription Changes): Exercise Prescription Changes - 01/11/17 1400      Response to Exercise   Blood Pressure (Admit)  128/64    Blood Pressure (Exercise)  130/60    Blood Pressure (Exit)  126/67    Heart Rate (Admit)  71 bpm    Heart Rate (Exercise)  118 bpm    Heart Rate (Exit)  72 bpm    Rating of Perceived Exertion (Exercise)  13    Symptoms  none    Duration  Continue with 45 min of aerobic exercise without signs/symptoms of physical distress.    Intensity  THRR unchanged      Progression   Progression  Continue to progress workloads to maintain intensity without signs/symptoms of physical distress.    Average METs  4.71      Resistance Training   Training Prescription  Yes    Weight  4 lbs    Reps  10-15      Interval Training   Interval Training  No      Treadmill   MPH  3.5    Grade  3    Minutes  15    METs  5.13      Recumbant Bike   Level  6    Watts  55    Minutes  15    METs  4.31      REL-XR   Level  4    Minutes  15    METs  4.7  Home Exercise Plan   Plans to continue exercise at  [facility] (comment) walking, [employer]    Frequency  Add 2 additional days to program exercise sessions.    Initial Home Exercises Provided  11/29/16       Functional Capacity: 6 Minute Walk    Row Name 11/18/16 1435 01/10/17 0853       6 Minute Walk   Phase  -  Discharge    Distance  1360 feet  1380 feet    Distance % Change  -  1.5 %    Distance Feet Change  -  20 ft    Walk Time  6 minutes  6 minutes    # of Rest Breaks  0  0    MPH  2.58  2.61    METS  2.99  2.87    RPE  15  12    VO2 Peak  10.5  10.03    Symptoms  No  No    Resting HR  65 bpm  60 bpm    Resting BP  132/54  128/64    Resting Oxygen Saturation   100 %  -    Exercise Oxygen Saturation  during 6 min walk  99 %  99 %    Max Ex. HR  102 bpm  86 bpm    Max Ex. BP  110/56  130/70       Quality of Life: Quality of Life - 01/12/17 0850      Quality of Life Scores   Health/Function Pre  22.93 %    Health/Function Post  22.93 %    Health/Function % Change  0 %    Socioeconomic Pre  29.58 %    Socioeconomic Post  30 %    Socioeconomic % Change   1.42 %    Psych/Spiritual Pre  28.79 %    Psych/Spiritual Post  26.86 %    Psych/Spiritual % Change  -6.7 %    Family Pre  26.4 %    Family Post  25.5 %    Family % Change  -3.41 %    GLOBAL Pre  25.91 %    GLOBAL Post  25.57 %    GLOBAL % Change  -1.31 %       Personal Goals: Goals established at orientation with  interventions provided to work toward goal. Personal Goals and Risk Factors at Admission - 11/18/16 1312      Core Components/Risk Factors/Patient Goals on Admission    Weight Management  Weight Loss;Yes    Intervention  Weight Management: Develop a combined nutrition and exercise program designed to reach desired caloric intake, while maintaining appropriate intake of nutrient and fiber, sodium and fats, and appropriate energy expenditure required for the weight goal.;Weight Management: Provide education and appropriate resources to help participant work on and attain dietary goals.;Weight Management/Obesity: Establish reasonable short term and long term weight goals.    Admit Weight  164 lb 8 oz (74.6 kg)    Goal Weight: Short Term  162 lb (73.5 kg)    Goal Weight: Long Term  150 lb (68 kg)    Expected Outcomes  Short Term: Continue to assess and modify interventions until short term weight is achieved;Weight Loss: Understanding of general recommendations for a balanced deficit meal plan, which promotes 1-2 lb weight loss per week and includes a negative energy balance of [phone] kcal/d    Improve shortness of breath with ADL's  Yes  Intervention  Provide education, individualized exercise plan and daily activity instruction to help decrease symptoms of SOB with activities of daily living.    Expected Outcomes  Short Term: Achieves a reduction of symptoms when performing activities of daily living.    Diabetes  Yes Recent HgbA1c less than 6%    Intervention  Provide education about signs/symptoms and action to take for hypo/hyperglycemia.;Provide education about proper nutrition, including hydration, and aerobic/resistive exercise prescription along with prescribed medications to achieve blood glucose in normal ranges: Fasting glucose 65-99 mg/dL    Expected Outcomes  Short Term: Participant verbalizes understanding of the signs/symptoms and immediate care of hyper/hypoglycemia, proper foot care  and importance of medication, aerobic/resistive exercise and nutrition plan for blood glucose control.;Long Term: Attainment of HbA1C < 7%.    Heart Failure  Yes    Intervention  Provide a combined exercise and nutrition program that is supplemented with education, support and counseling about heart failure. Directed toward relieving symptoms such as shortness of breath, decreased exercise tolerance, and extremity edema.    Expected Outcomes  Improve functional capacity of life;Short term: Attendance in program 2-3 days a week with increased exercise capacity. Reported lower sodium intake. Reported increased fruit and vegetable intake. Reports medication compliance.;Short term: Daily weights obtained and reported for increase. Utilizing diuretic protocols set by physician.;Long term: Adoption of self-care skills and reduction of barriers for early signs and symptoms recognition and intervention leading to self-care maintenance.    Hypertension  Yes    Intervention  Provide education on lifestyle modifcations including regular physical activity/exercise, weight management, moderate sodium restriction and increased consumption of fresh fruit, vegetables, and low fat dairy, alcohol moderation, and smoking cessation.;Monitor prescription use compliance.    Expected Outcomes  Short Term: Continued assessment and intervention until BP is < 140/96m HG in hypertensive participants. < 130/857mHG in hypertensive participants with diabetes, heart failure or chronic kidney disease.;Long Term: Maintenance of blood pressure at goal levels.    Lipids  Yes    Intervention  Provide education and support for participant on nutrition & aerobic/resistive exercise along with prescribed medications to achieve LDL <7060mHDL >39m74m  Expected Outcomes  Short Term: Participant states understanding of desired cholesterol values and is compliant with medications prescribed. Participant is following exercise prescription and  nutrition guidelines.;Long Term: Cholesterol controlled with medications as prescribed, with individualized exercise RX and with personalized nutrition plan. Value goals: LDL < 70mg18mL > 40 mg.        Personal Goals Discharge: Goals and Risk Factor Review - 12/20/16 0808      Core Components/Risk Factors/Patient Goals Review   Personal Goals Review  Weight Management/Obesity;Lipids;Hypertension;Diabetes    Review  CharlGeryoing well in rehab.  He is down a few a pounds at holding at 165 lbs.  Blood pressures and blood sugars have been good and he is not having any issues with his medications. He has not had any major symptoms with his heart failure.  On occasion he has been short of breath, but says that it is manageable by sitting down and take an extra fluid pill if bad enough.    Expected Outcomes  Short: Continue to work on weight loss.  Long: Continue to manage his heart failure.        Exercise Goals and Review: Exercise Goals    Row Name 11/18/16 1434             Exercise Goals   Increase Physical  Activity  Yes       Intervention  Provide advice, education, support and counseling about physical activity/exercise needs.;Develop an individualized exercise prescription for aerobic and resistive training based on initial evaluation findings, risk stratification, comorbidities and participant's personal goals.       Expected Outcomes  Achievement of increased cardiorespiratory fitness and enhanced flexibility, muscular endurance and strength shown through measurements of functional capacity and personal statement of participant.       Increase Strength and Stamina  Yes       Intervention  Provide advice, education, support and counseling about physical activity/exercise needs.;Develop an individualized exercise prescription for aerobic and resistive training based on initial evaluation findings, risk stratification, comorbidities and participant's personal goals.       Expected  Outcomes  Achievement of increased cardiorespiratory fitness and enhanced flexibility, muscular endurance and strength shown through measurements of functional capacity and personal statement of participant.       Able to understand and use rate of perceived exertion (RPE) scale  Yes       Intervention  Provide education and explanation on how to use RPE scale       Expected Outcomes  Short Term: Able to use RPE daily in rehab to express subjective intensity level;Long Term:  Able to use RPE to guide intensity level when exercising independently       Able to understand and use Dyspnea scale  Yes       Intervention  Provide education and explanation on how to use Dyspnea scale       Expected Outcomes  Short Term: Able to use Dyspnea scale daily in rehab to express subjective sense of shortness of breath during exertion;Long Term: Able to use Dyspnea scale to guide intensity level when exercising independently       Knowledge and understanding of Target Heart Rate Range (THRR)  Yes       Intervention  Provide education and explanation of THRR including how the numbers were predicted and where they are located for reference       Expected Outcomes  Short Term: Able to state/look up THRR;Short Term: Able to use daily as guideline for intensity in rehab;Long Term: Able to use THRR to govern intensity when exercising independently       Able to check pulse independently  Yes       Intervention  Provide education and demonstration on how to check pulse in carotid and radial arteries.;Review the importance of being able to check your own pulse for safety during independent exercise       Expected Outcomes  Short Term: Able to explain why pulse checking is important during independent exercise;Long Term: Able to check pulse independently and accurately       Understanding of Exercise Prescription  Yes       Intervention  Provide education, explanation, and written materials on patient's individual exercise  prescription       Expected Outcomes  Short Term: Able to explain program exercise prescription;Long Term: Able to explain home exercise prescription to exercise independently          Nutrition & Weight - Outcomes: Pre Biometrics - 11/18/16 1434      Pre Biometrics   Height  5' 10"  (1.778 m)    Weight  164 lb 8 oz (74.6 kg)    Waist Circumference  38 inches    Hip Circumference  42 inches    Waist to Hip Ratio  0.9 %  BMI (Calculated)  23.6    Single Leg Stand  30 seconds      Post Biometrics - 01/10/17 0856       Post  Biometrics   Height  5' 10"  (1.778 m)    Weight  164 lb (74.4 kg)    Waist Circumference  36 inches    Hip Circumference  40 inches    Waist to Hip Ratio  0.9 %    BMI (Calculated)  23.53    Single Leg Stand  30 seconds       Nutrition: Nutrition Therapy & Goals - 12/20/16 0811      Nutrition Therapy   RD appointment defered  Yes       Nutrition Discharge: Nutrition Assessments - 01/12/17 0850      MEDFICTS Scores   Pre Score  80    Post Score  47    Score Difference  -33       Education Questionnaire Score: Knowledge Questionnaire Score - 01/12/17 0849      Knowledge Questionnaire Score   Pre Score  22/28    Post Score  27/28       Goals reviewed with patient; copy given to patient.

## 2017-01-14 NOTE — Progress Notes (Signed)
Daily Session Note  Patient Details  Name: AMMAN BARTEL MRN: [id number] Date of Birth: 04-12-1940 Referring Provider:     Cardiac Rehab from 11/18/2016 in [facility]  Referring Provider  Sabra Heck      Encounter Date: 01/14/2017  Check In: Session Check In - 01/14/17 0844      Check-In   Location  [facility]Card[facility] Pul[facility] Re[facility]    Staff Present  Nada Maclachlan, BA, ACSM CEP, Exercise Physiologist;Carroll Enterkin, RN, Levie Heritage, MA, ACSM RCEP, Exercise Physiologist    Supervising physician immediately available to respond to emergencies  See telemetry face sheet for immediately available ER MD    Medication changes reported      No    Fall or balance concerns reported     No    Warm-up and Cool-down  Performed on first and last piece of equipment    Resistance Training Performed  Yes    VAD Patient?  No      Pain Assessment   Currently in Pain?  No/denies    Multiple Pain Sites  No          Social History   Tobacco Use  Smoking Status Never Smoker  Smokeless Tobacco Never Used    Goals Met:  Independence with exercise equipment Exercise tolerated well No report of cardiac concerns or symptoms Strength training completed today  Goals Unmet:  Not Applicable  Comments: Pt able to follow exercise prescription today without complaint.  Will continue to monitor for progression.   Dr. Emily Filbert is Medical Director for [facility] and [facility].

## 2017-01-17 ENCOUNTER — Encounter: Payer: PPO | Admitting: *Deleted

## 2017-01-17 DIAGNOSIS — I5022 Chronic systolic (congestive) heart failure: Secondary | ICD-10-CM

## 2017-01-17 NOTE — Progress Notes (Signed)
Discharge Progress Report  Patient Details  Name: William Snyder MRN: [id number] Date of Birth: 1940/07/27 Referring Provider:     Cardiac Rehab from 11/18/2016 in [facility]  Referring Provider  Sabra Heck       Number of Visits: 36/36  Reason for Discharge:  Patient reached a stable level of exercise. Patient independent in their exercise. Patient has met program and personal goals.  Smoking History:  Social History   Tobacco Use  Smoking Status Never Smoker  Smokeless Tobacco Never Used    Diagnosis:  Heart failure, chronic systolic (HCC)  ADL UCSD:   Initial Exercise Prescription: Initial Exercise Prescription - 11/18/16 1400      Date of Initial Exercise RX and Referring Provider   Date  11/18/16    Referring Provider  Sabra Heck      Treadmill   MPH  2.7    Grade  2.5    Minutes  15    METs  4      Recumbant Bike   Level  5    RPM  60    Watts  45    Minutes  15    METs  3.9      NuStep   Level  4    SPM  80    Minutes  15    METs  4      REL-XR   Level  3    Speed  50    Minutes  15    METs  4      Prescription Details   Frequency (times per week)  3    Duration  Progress to 45 minutes of aerobic exercise without signs/symptoms of physical distress      Intensity   THRR 40-80% of Max Heartrate  96-128    Ratings of Perceived Exertion  11-13    Perceived Dyspnea  0-4      Resistance Training   Training Prescription  Yes    Weight  3    Reps  10-15       Discharge Exercise Prescription (Final Exercise Prescription Changes): Exercise Prescription Changes - 01/11/17 1400      Response to Exercise   Blood Pressure (Admit)  128/64    Blood Pressure (Exercise)  130/60    Blood Pressure (Exit)  126/67    Heart Rate (Admit)  71 bpm    Heart Rate (Exercise)  118 bpm    Heart Rate (Exit)  72 bpm    Rating of Perceived Exertion (Exercise)  13    Symptoms  none    Duration  Continue with 45 min of aerobic exercise  without signs/symptoms of physical distress.    Intensity  THRR unchanged      Progression   Progression  Continue to progress workloads to maintain intensity without signs/symptoms of physical distress.    Average METs  4.71      Resistance Training   Training Prescription  Yes    Weight  4 lbs    Reps  10-15      Interval Training   Interval Training  No      Treadmill   MPH  3.5    Grade  3    Minutes  15    METs  5.13      Recumbant Bike   Level  6    Watts  55    Minutes  15    METs  4.31  REL-XR   Level  4    Minutes  15    METs  4.7      Home Exercise Plan   Plans to continue exercise at  [facility] (comment) walking, [employer]    Frequency  Add 2 additional days to program exercise sessions.    Initial Home Exercises Provided  11/29/16       Functional Capacity: 6 Minute Walk    Row Name 11/18/16 1435 01/10/17 0853       6 Minute Walk   Phase  -  Discharge    Distance  1360 feet  1380 feet    Distance % Change  -  1.5 %    Distance Feet Change  -  20 ft    Walk Time  6 minutes  6 minutes    # of Rest Breaks  0  0    MPH  2.58  2.61    METS  2.99  2.87    RPE  15  12    VO2 Peak  10.5  10.03    Symptoms  No  No    Resting HR  65 bpm  60 bpm    Resting BP  132/54  128/64    Resting Oxygen Saturation   100 %  -    Exercise Oxygen Saturation  during 6 min walk  99 %  99 %    Max Ex. HR  102 bpm  86 bpm    Max Ex. BP  110/56  130/70       Psychological, QOL, Others - Outcomes: PHQ 2/9: Depression screen [facility] 2/9 01/12/2017 11/18/2016  Decreased Interest 0 0  Down, Depressed, Hopeless 0 0  PHQ - 2 Score 0 0  Altered sleeping 0 0  Tired, decreased energy 1 2  Change in appetite 0 0  Feeling bad or failure about yourself  0 0  Trouble concentrating 0 0  Moving slowly or fidgety/restless 0 0  Suicidal thoughts 0 0  PHQ-9 Score 1 2  Difficult doing work/chores Not difficult at all Somewhat difficult    Quality of  Life: Quality of Life - 01/12/17 0850      Quality of Life Scores   Health/Function Pre  22.93 %    Health/Function Post  22.93 %    Health/Function % Change  0 %    Socioeconomic Pre  29.58 %    Socioeconomic Post  30 %    Socioeconomic % Change   1.42 %    Psych/Spiritual Pre  28.79 %    Psych/Spiritual Post  26.86 %    Psych/Spiritual % Change  -6.7 %    Family Pre  26.4 %    Family Post  25.5 %    Family % Change  -3.41 %    GLOBAL Pre  25.91 %    GLOBAL Post  25.57 %    GLOBAL % Change  -1.31 %       Personal Goals: Goals established at orientation with interventions provided to work toward goal. Personal Goals and Risk Factors at Admission - 11/18/16 1312      Core Components/Risk Factors/Patient Goals on Admission    Weight Management  Weight Loss;Yes    Intervention  Weight Management: Develop a combined nutrition and exercise program designed to reach desired caloric intake, while maintaining appropriate intake of nutrient and fiber, sodium and fats, and appropriate energy expenditure required for the weight goal.;Weight Management: Provide education and appropriate  resources to help participant work on and attain dietary goals.;Weight Management/Obesity: Establish reasonable short term and long term weight goals.    Admit Weight  164 lb 8 oz (74.6 kg)    Goal Weight: Short Term  162 lb (73.5 kg)    Goal Weight: Long Term  150 lb (68 kg)    Expected Outcomes  Short Term: Continue to assess and modify interventions until short term weight is achieved;Weight Loss: Understanding of general recommendations for a balanced deficit meal plan, which promotes 1-2 lb weight loss per week and includes a negative energy balance of [phone] kcal/d    Improve shortness of breath with ADL's  Yes    Intervention  Provide education, individualized exercise plan and daily activity instruction to help decrease symptoms of SOB with activities of daily living.    Expected Outcomes  Short Term:  Achieves a reduction of symptoms when performing activities of daily living.    Diabetes  Yes Recent HgbA1c less than 6%    Intervention  Provide education about signs/symptoms and action to take for hypo/hyperglycemia.;Provide education about proper nutrition, including hydration, and aerobic/resistive exercise prescription along with prescribed medications to achieve blood glucose in normal ranges: Fasting glucose 65-99 mg/dL    Expected Outcomes  Short Term: Participant verbalizes understanding of the signs/symptoms and immediate care of hyper/hypoglycemia, proper foot care and importance of medication, aerobic/resistive exercise and nutrition plan for blood glucose control.;Long Term: Attainment of HbA1C < 7%.    Heart Failure  Yes    Intervention  Provide a combined exercise and nutrition program that is supplemented with education, support and counseling about heart failure. Directed toward relieving symptoms such as shortness of breath, decreased exercise tolerance, and extremity edema.    Expected Outcomes  Improve functional capacity of life;Short term: Attendance in program 2-3 days a week with increased exercise capacity. Reported lower sodium intake. Reported increased fruit and vegetable intake. Reports medication compliance.;Short term: Daily weights obtained and reported for increase. Utilizing diuretic protocols set by physician.;Long term: Adoption of self-care skills and reduction of barriers for early signs and symptoms recognition and intervention leading to self-care maintenance.    Hypertension  Yes    Intervention  Provide education on lifestyle modifcations including regular physical activity/exercise, weight management, moderate sodium restriction and increased consumption of fresh fruit, vegetables, and low fat dairy, alcohol moderation, and smoking cessation.;Monitor prescription use compliance.    Expected Outcomes  Short Term: Continued assessment and intervention until BP is <  140/27m HG in hypertensive participants. < 130/860mHG in hypertensive participants with diabetes, heart failure or chronic kidney disease.;Long Term: Maintenance of blood pressure at goal levels.    Lipids  Yes    Intervention  Provide education and support for participant on nutrition & aerobic/resistive exercise along with prescribed medications to achieve LDL <701mHDL >59m33m  Expected Outcomes  Short Term: Participant states understanding of desired cholesterol values and is compliant with medications prescribed. Participant is following exercise prescription and nutrition guidelines.;Long Term: Cholesterol controlled with medications as prescribed, with individualized exercise RX and with personalized nutrition plan. Value goals: LDL < 70mg36mL > 40 mg.        Personal Goals Discharge: Goals and Risk Factor Review    Row Name 12/20/16 0808             Core Components/Risk Factors/Patient Goals Review   Personal Goals Review  Weight Management/Obesity;Lipids;Hypertension;Diabetes       Review  CharlAbelardooing  well in rehab.  He is down a few a pounds at holding at 165 lbs.  Blood pressures and blood sugars have been good and he is not having any issues with his medications. He has not had any major symptoms with his heart failure.  On occasion he has been short of breath, but says that it is manageable by sitting down and take an extra fluid pill if bad enough.       Expected Outcomes  Short: Continue to work on weight loss.  Long: Continue to manage his heart failure.           Exercise Goals and Review: Exercise Goals    Row Name 11/18/16 1434             Exercise Goals   Increase Physical Activity  Yes       Intervention  Provide advice, education, support and counseling about physical activity/exercise needs.;Develop an individualized exercise prescription for aerobic and resistive training based on initial evaluation findings, risk stratification, comorbidities and  participant's personal goals.       Expected Outcomes  Achievement of increased cardiorespiratory fitness and enhanced flexibility, muscular endurance and strength shown through measurements of functional capacity and personal statement of participant.       Increase Strength and Stamina  Yes       Intervention  Provide advice, education, support and counseling about physical activity/exercise needs.;Develop an individualized exercise prescription for aerobic and resistive training based on initial evaluation findings, risk stratification, comorbidities and participant's personal goals.       Expected Outcomes  Achievement of increased cardiorespiratory fitness and enhanced flexibility, muscular endurance and strength shown through measurements of functional capacity and personal statement of participant.       Able to understand and use rate of perceived exertion (RPE) scale  Yes       Intervention  Provide education and explanation on how to use RPE scale       Expected Outcomes  Short Term: Able to use RPE daily in rehab to express subjective intensity level;Long Term:  Able to use RPE to guide intensity level when exercising independently       Able to understand and use Dyspnea scale  Yes       Intervention  Provide education and explanation on how to use Dyspnea scale       Expected Outcomes  Short Term: Able to use Dyspnea scale daily in rehab to express subjective sense of shortness of breath during exertion;Long Term: Able to use Dyspnea scale to guide intensity level when exercising independently       Knowledge and understanding of Target Heart Rate Range (THRR)  Yes       Intervention  Provide education and explanation of THRR including how the numbers were predicted and where they are located for reference       Expected Outcomes  Short Term: Able to state/look up THRR;Short Term: Able to use daily as guideline for intensity in rehab;Long Term: Able to use THRR to govern intensity when  exercising independently       Able to check pulse independently  Yes       Intervention  Provide education and demonstration on how to check pulse in carotid and radial arteries.;Review the importance of being able to check your own pulse for safety during independent exercise       Expected Outcomes  Short Term: Able to explain why pulse checking is important during independent exercise;Long Term:  Able to check pulse independently and accurately       Understanding of Exercise Prescription  Yes       Intervention  Provide education, explanation, and written materials on patient's individual exercise prescription       Expected Outcomes  Short Term: Able to explain program exercise prescription;Long Term: Able to explain home exercise prescription to exercise independently          Nutrition & Weight - Outcomes: Pre Biometrics - 11/18/16 1434      Pre Biometrics   Height  5' 10" (1.778 m)    Weight  164 lb 8 oz (74.6 kg)    Waist Circumference  38 inches    Hip Circumference  42 inches    Waist to Hip Ratio  0.9 %    BMI (Calculated)  23.6    Single Leg Stand  30 seconds      Post Biometrics - 01/10/17 0856       Post  Biometrics   Height  5' 10" (1.778 m)    Weight  164 lb (74.4 kg)    Waist Circumference  36 inches    Hip Circumference  40 inches    Waist to Hip Ratio  0.9 %    BMI (Calculated)  23.53    Single Leg Stand  30 seconds       Nutrition: Nutrition Therapy & Goals - 12/20/16 0811      Nutrition Therapy   RD appointment defered  Yes       Nutrition Discharge: Nutrition Assessments - 01/12/17 0850      MEDFICTS Scores   Pre Score  80    Post Score  47    Score Difference  -33       Education Questionnaire Score: Knowledge Questionnaire Score - 01/12/17 0849      Knowledge Questionnaire Score   Pre Score  22/28    Post Score  27/28       Goals reviewed with patient; copy given to patient.

## 2017-01-17 NOTE — Progress Notes (Signed)
Daily Session Note  Patient Details  Name: William Snyder MRN: [id number] Date of Birth: 12/11/1940 Referring Provider:     Cardiac Rehab from 11/18/2016 in [facility] Cardiac [facility] Rehab  Referring Provider  Sabra Heck      Encounter Date: 01/17/2017  Check In: Session Check In - 01/17/17 0933      Check-In   Location  [facility]-Cardiac & Pulmonary Rehab    Staff Present  Earlean Shawl, BS, ACSM CEP, Exercise Physiologist;Carroll Enterkin, RN, Levie Heritage, MA, ACSM RCEP, Exercise Physiologist    Supervising physician immediately available to respond to emergencies  See telemetry face sheet for immediately available ER MD    Medication changes reported      No    Fall or balance concerns reported     No    Warm-up and Cool-down  Performed on first and last piece of equipment    Resistance Training Performed  Yes    VAD Patient?  No      Pain Assessment   Currently in Pain?  No/denies          Social History   Tobacco Use  Smoking Status Never Smoker  Smokeless Tobacco Never Used    Goals Met:  Independence with exercise equipment Exercise tolerated well No report of cardiac concerns or symptoms Strength training completed today  Goals Unmet:  Not Applicable  Comments: Pt able to follow exercise prescription today without complaint.  Will continue to monitor for progression.  Voris graduated today from cardiac rehab with 36 sessions completed.  Details of the patient's exercise prescription and what He needs to do in order to continue the prescription and progress were discussed with patient.  Patient was given a copy of prescription and goals.  Patient verbalized understanding.  Tayvien plans to continue to exercise by walking.    Dr. Emily Filbert is Medical Director for [facility].

## 2017-01-17 NOTE — Progress Notes (Signed)
Cardiac Individual Treatment Plan  Patient Details  Name: William Snyder MRN: [id number] Date of Birth: [birth year] Referring Provider:     Cardiac Rehab from 11/18/2016 in [facility] Cardiac and Pulmonary Rehab  Referring Provider  Sabra Heck      Initial Encounter Date:    Cardiac Rehab from 11/18/2016 in [facility] Cardiac and Pulmonary Rehab  Date  11/18/16  Referring Provider  Sabra Heck      Visit Diagnosis: Heart failure, chronic systolic ([place])  Patient's Home Medications on Admission:  Current Outpatient Medications:  .  aspirin 81 MG tablet, Take 81 mg by mouth daily., Disp: , Rfl:  .  furosemide (LASIX) 20 MG tablet, Take 20 mg by mouth 2 (two) times daily., Disp: , Rfl:  .  glimepiride (AMARYL) 4 MG tablet, Take 4 mg by mouth daily., Disp: , Rfl:  .  magnesium oxide (MAG-OX) 400 MG tablet, Take by mouth., Disp: , Rfl:  .  metFORMIN (GLUCOPHAGE) 500 MG tablet, Take by mouth., Disp: , Rfl:  .  metoprolol tartrate (LOPRESSOR) 25 MG tablet, Take by mouth., Disp: , Rfl:  .  potassium chloride (K-DUR) 10 MEQ tablet, Take 10 mEq by mouth 2 (two) times daily., Disp: , Rfl:  .  sacubitril-valsartan (ENTRESTO) 24-26 MG, Take by mouth., Disp: , Rfl:  .  simvastatin (ZOCOR) 40 MG tablet, Take 40 mg by mouth daily., Disp: , Rfl:  .  sitaGLIPtin (JANUVIA) 50 MG tablet, Take by mouth., Disp: , Rfl:  .  spironolactone (ALDACTONE) 25 MG tablet, Take 25 mg by mouth daily., Disp: , Rfl:  No current facility-administered medications for this visit.   Facility-Administered Medications Ordered in Other Visits:  .  0.9 %  sodium chloride infusion, 250 mL, Intravenous, PRN, Teodoro Spray, MD .  0.9 %  sodium chloride infusion, , Intravenous, Continuous, Fath, Javier Docker, MD .  sodium chloride flush (NS) 0.9 % injection 3 mL, 3 mL, Intravenous, Q12H, Fath, Kenneth A, MD .  sodium chloride flush (NS) 0.9 % injection 3 mL, 3 mL, Intravenous, PRN, Ubaldo Glassing Javier Docker, MD  Past Medical History: Past Medical History:   Diagnosis Date  . Diabetes ([place])    Takes Metformin, Glipizide and Januvia  . High cholesterol   . Hypertension   . Prostate cancer [facility]) 2006   Prostatectomy.    Tobacco Use: Social History   Tobacco Use  Smoking Status Never Smoker  Smokeless Tobacco Never Used    Labs: Recent Review Flowsheet Data    There is no flowsheet data to display.       Exercise Target Goals:    Exercise Program Goal: Individual exercise prescription set with THRR, safety & activity barriers. Participant demonstrates ability to understand and report RPE using BORG scale, to self-measure pulse accurately, and to acknowledge the importance of the exercise prescription.  Exercise Prescription Goal: Starting with aerobic activity 30 plus minutes a day, 3 days per week for initial exercise prescription. Provide home exercise prescription and guidelines that participant acknowledges understanding prior to discharge.  Activity Barriers & Risk Stratification: Activity Barriers & Cardiac Risk Stratification - 11/18/16 1312      Activity Barriers & Cardiac Risk Stratification   Activity Barriers  Shortness of Breath;Other (comment)    Comments  fatigue    Cardiac Risk Stratification  High       6 Minute Walk: 6 Minute Walk    Row Name 11/18/16 1435 01/10/17 0853       6 Minute Walk  Phase  -  Discharge    Distance  1360 feet  1380 feet    Distance % Change  -  1.5 %    Distance Feet Change  -  20 ft    Walk Time  6 minutes  6 minutes    # of Rest Breaks  0  0    MPH  2.58  2.61    METS  2.99  2.87    RPE  15  12    VO2 Peak  10.5  10.03    Symptoms  No  No    Resting HR  65 bpm  60 bpm    Resting BP  132/54  128/64    Resting Oxygen Saturation   100 %  -    Exercise Oxygen Saturation  during 6 min walk  99 %  99 %    Max Ex. HR  102 bpm  86 bpm    Max Ex. BP  110/56  130/70       Oxygen Initial Assessment:   Oxygen Re-Evaluation:   Oxygen Discharge (Final Oxygen  Re-Evaluation):   Initial Exercise Prescription: Initial Exercise Prescription - 11/18/16 1400      Date of Initial Exercise RX and Referring Provider   Date  11/18/16    Referring Provider  Sabra Heck      Treadmill   MPH  2.7    Grade  2.5    Minutes  15    METs  4      Recumbant Bike   Level  5    RPM  60    Watts  45    Minutes  15    METs  3.9      NuStep   Level  4    SPM  80    Minutes  15    METs  4      REL-XR   Level  3    Speed  50    Minutes  15    METs  4      Prescription Details   Frequency (times per week)  3    Duration  Progress to 45 minutes of aerobic exercise without signs/symptoms of physical distress      Intensity   THRR 40-80% of Max Heartrate  96-128    Ratings of Perceived Exertion  11-13    Perceived Dyspnea  0-4      Resistance Training   Training Prescription  Yes    Weight  3    Reps  10-15       Perform Capillary Blood Glucose checks as needed.  Exercise Prescription Changes:  Exercise Prescription Changes    Row Name 11/29/16 0800 12/02/16 1400 12/16/16 1300 12/29/16 1500 01/11/17 1400     Response to Exercise   Blood Pressure (Admit)  -  134/74  106/54  100/56  128/64   Blood Pressure (Exercise)  -  146/70  124/58  110/58  130/60   Blood Pressure (Exit)  -  90/50  90/48  122/69  126/67   Heart Rate (Admit)  -  63 bpm  66 bpm  60 bpm  71 bpm   Heart Rate (Exercise)  -  116 bpm  111 bpm  120 bpm  118 bpm   Heart Rate (Exit)  -  77 bpm  65 bpm  70 bpm  72 bpm   Rating of Perceived Exertion (Exercise)  -  13  13  13  13   Symptoms  -  none  none  none  none   Duration  -  Progress to 45 minutes of aerobic exercise without signs/symptoms of physical distress  Continue with 45 min of aerobic exercise without signs/symptoms of physical distress.  Continue with 45 min of aerobic exercise without signs/symptoms of physical distress.  Continue with 45 min of aerobic exercise without signs/symptoms of physical distress.    Intensity  -  THRR unchanged  THRR unchanged  THRR unchanged  THRR unchanged     Progression   Progression  -  Continue to progress workloads to maintain intensity without signs/symptoms of physical distress.  Continue to progress workloads to maintain intensity without signs/symptoms of physical distress.  Continue to progress workloads to maintain intensity without signs/symptoms of physical distress.  Continue to progress workloads to maintain intensity without signs/symptoms of physical distress.   Average METs  -  3.99  4.6  4.47  4.71     Resistance Training   Training Prescription  -  Yes  Yes  Yes  Yes   Weight  -  3 lbs  3 lb  4 lbs  4 lbs   Reps  -  10-15  10-15  10-15  10-15     Interval Training   Interval Training  -  No  No  No  No     Treadmill   MPH  -  3.5  3.5  3.5  3.5   Grade  -  _0 Minutes  -  _1 METs  -  5.13  5.13  5.13  5.13     Recumbant Bike   Level  -  5  -  5  6   Watts  -  25  -  -  55   Minutes  -  15  -  15  15   METs  -  3.21  -  4.1  4.31     REL-XR   Level  -  _2 Minutes  -  _3 METs  -  3.8  4.2  5  4.7     Home Exercise Plan   Plans to continue exercise at  [employer] (comment) walking, [employer] (comment) walking, [employer] (comment) walking, [employer] (comment) walking, [employer] (comment) walking, [employer]   Frequency  Add 2 additional days to program exercise sessions.  Add 2 additional days to program exercise sessions.  Add 2 additional days to program exercise sessions.  Add 2 additional days to program exercise sessions.  Add 2 additional days to program exercise sessions.   Initial Home Exercises Provided  11/29/16  11/29/16  11/29/16  11/29/16  11/29/16      Exercise Comments:  Exercise Comments    Row Name 11/22/16 4097 01/17/17 0940         Exercise Comments   First  full day of exercise!  Patient was oriented to gym and equipment including functions, settings, policies, and procedures.  Patient's individual exercise prescription and treatment plan were reviewed.  All starting workloads were established based on the results of the 6 minute walk test done at initial orientation visit.  The plan for exercise progression was also introduced and progression  will be customized based on patient's performance and goals.   William Snyder graduated today from cardiac rehab with 36 sessions completed.  Details of the patient's exercise prescription and what He needs to do in order to continue the prescription and progress were discussed with patient.  Patient was given a copy of prescription and goals.  Patient verbalized understanding.  William Snyder plans to continue to exercise by walking.         Exercise Goals and Review:  Exercise Goals    Row Name 11/18/16 1434             Exercise Goals   Increase Physical Activity  Yes       Intervention  Provide advice, education, support and counseling about physical activity/exercise needs.;Develop an individualized exercise prescription for aerobic and resistive training based on initial evaluation findings, risk stratification, comorbidities and participant's personal goals.       Expected Outcomes  Achievement of increased cardiorespiratory fitness and enhanced flexibility, muscular endurance and strength shown through measurements of functional capacity and personal statement of participant.       Increase Strength and Stamina  Yes       Intervention  Provide advice, education, support and counseling about physical activity/exercise needs.;Develop an individualized exercise prescription for aerobic and resistive training based on initial evaluation findings, risk stratification, comorbidities and participant's personal goals.       Expected Outcomes  Achievement of increased cardiorespiratory fitness and enhanced flexibility,  muscular endurance and strength shown through measurements of functional capacity and personal statement of participant.       Able to understand and use rate of perceived exertion (RPE) scale  Yes       Intervention  Provide education and explanation on how to use RPE scale       Expected Outcomes  Short Term: Able to use RPE daily in rehab to express subjective intensity level;Long Term:  Able to use RPE to guide intensity level when exercising independently       Able to understand and use Dyspnea scale  Yes       Intervention  Provide education and explanation on how to use Dyspnea scale       Expected Outcomes  Short Term: Able to use Dyspnea scale daily in rehab to express subjective sense of shortness of breath during exertion;Long Term: Able to use Dyspnea scale to guide intensity level when exercising independently       Knowledge and understanding of Target Heart Rate Range (THRR)  Yes       Intervention  Provide education and explanation of THRR including how the numbers were predicted and where they are located for reference       Expected Outcomes  Short Term: Able to state/look up THRR;Short Term: Able to use daily as guideline for intensity in rehab;Long Term: Able to use THRR to govern intensity when exercising independently       Able to check pulse independently  Yes       Intervention  Provide education and demonstration on how to check pulse in carotid and radial arteries.;Review the importance of being able to check your own pulse for safety during independent exercise       Expected Outcomes  Short Term: Able to explain why pulse checking is important during independent exercise;Long Term: Able to check pulse independently and accurately       Understanding of Exercise Prescription  Yes       Intervention  Provide education, explanation,  and written materials on patient's individual exercise prescription       Expected Outcomes  Short Term: Able to explain program exercise  prescription;Long Term: Able to explain home exercise prescription to exercise independently          Exercise Goals Re-Evaluation : Exercise Goals Re-Evaluation    Row Name 11/22/16 1062 11/29/16 0848 12/02/16 1441 12/16/16 1357 12/20/16 0805     Exercise Goal Re-Evaluation   Exercise Goals Review  Understanding of Exercise Prescription;Knowledge and understanding of Target Heart Rate Range (THRR);Able to understand and use rate of perceived exertion (RPE) scale  Increase Physical Activity;Knowledge and understanding of Target Heart Rate Range (THRR);Able to check pulse independently;Able to understand and use rate of perceived exertion (RPE) scale;Understanding of Exercise Prescription  Increase Physical Activity;Increase Strength and Stamina  Increase Physical Activity;Increase Strength and Stamina  Increase Physical Activity;Increase Strength and Stamina;Understanding of Exercise Prescription   Comments  Reviewed RPE scale, THR and program prescription with pt today.  Pt voiced understanding and was given a copy of goals to take home.   Reviewed home exercise with pt today.  Pt plans to continue walk and return to [employer] for exercise.  Reviewed THR, pulse, RPE, sign and symptoms, and when to call 911 or MD.  Also discussed weather considerations and indoor options.  Pt voiced understanding.  William Snyder is off to a good start in rehab.  He is already up to 3.5 mph on the treadmill.  He is feeling better and already returned to the gym.  We will continue to monitor his progression.   William Snyder is tolerating exercise well and has done 45 min continuous.    William Snyder is doing well in rehab.  He feels that he is getting better and his strength and stamina are getting better.  He was able to walk to [place] this weekend without a problem.  He is going up to [employer] twice a week and walking on the treadmill. He does use the machines some.    Expected Outcomes  Short: Use RPE  daily to regulate intensity.  Long: Follow program prescription in THR.  Short: He going to return to gym two days a week.  Long: Continue to exercise independently  Short: William Snyder to gym two days a week.  Long: Continue to build strength and stamina with exercise.   Short - Continue to build intensity.  Long - Maintain fitness level on his own.   Short: Discuss interval training.   Long: Continue to exercise independently.   William Snyder Name 01/10/17 0857 01/11/17 1447           Exercise Goal Re-Evaluation   Exercise Goals Review  Increase Physical Activity;Increase Strength and Stamina  Increase Physical Activity;Increase Strength and Stamina;Understanding of Exercise Prescription      Comments  6 min walk test done today with patient. He increased his walk distance by 20 feet. See 6 min walk data sheet for detailed report.   William Snyder has continued to do well in rehab. He will be graduating soon.  He has worked up to 55 watts on the recumbent bike.  We will continue to monitor his progression.       Expected Outcomes  -  Short: Graduate.  Long: Continue to exercise independently.          Discharge Exercise Prescription (Final Exercise Prescription Changes): Exercise Prescription Changes - 01/11/17 1400      Response to Exercise   Blood Pressure (  Admit)  128/64    Blood Pressure (Exercise)  130/60    Blood Pressure (Exit)  126/67    Heart Rate (Admit)  71 bpm    Heart Rate (Exercise)  118 bpm    Heart Rate (Exit)  72 bpm    Rating of Perceived Exertion (Exercise)  13    Symptoms  none    Duration  Continue with 45 min of aerobic exercise without signs/symptoms of physical distress.    Intensity  THRR unchanged      Progression   Progression  Continue to progress workloads to maintain intensity without signs/symptoms of physical distress.    Average METs  4.71      Resistance Training   Training Prescription  Yes    Weight  4 lbs    Reps  10-15      Interval Training   Interval Training   No      Treadmill   MPH  3.5    Grade  3    Minutes  15    METs  5.13      Recumbant Bike   Level  6    Watts  55    Minutes  15    METs  4.31      REL-XR   Level  4    Minutes  15    METs  4.7      Home Exercise Plan   Plans to continue exercise at  [employer] (comment) walking, [employer]    Frequency  Add 2 additional days to program exercise sessions.    Initial Home Exercises Provided  11/29/16       Nutrition:  Target Goals: Understanding of nutrition guidelines, daily intake of sodium <1563m, cholesterol <2029m calories 30% from fat and 7% or less from saturated fats, daily to have 5 or more servings of fruits and vegetables.  Biometrics: Pre Biometrics - 11/18/16 1434      Pre Biometrics   Height  _0  (1.778 m)    Weight  164 lb 8 oz (74.6 kg)    Waist Circumference  38 inches    Hip Circumference  42 inches    Waist to Hip Ratio  0.9 %    BMI (Calculated)  23.6    Single Leg Stand  30 seconds      Post Biometrics - 01/10/17 0856       Post  Biometrics   Height  _1  (1.778 m)    Weight  164 lb (74.4 kg)    Waist Circumference  36 inches    Hip Circumference  40 inches    Waist to Hip Ratio  0.9 %    BMI (Calculated)  23.53    Single Leg Stand  30 seconds       Nutrition Therapy Plan and Nutrition Goals: Nutrition Therapy & Goals - 12/20/16 0811      Nutrition Therapy   RD appointment defered  Yes       Nutrition Discharge: Rate Your Plate Scores: Nutrition Assessments - 01/12/17 0850      MEDFICTS Scores   Pre Score  80    Post Score  47    Score Difference  -33       Nutrition Goals Re-Evaluation: Nutrition Goals Re-Evaluation    Row Name 12/20/16 0811             Goals   Current Weight  165 lb (74.8 kg)  Nutrition Goal  Watch what he eats and base diet off of what he has learned in class.       Comment  Roper does not want to meet with dietician.  He was able to gleam some insight from the  education classes and plans to use that as guideance.  He admits to being a picky eater and wanting to stick with what he knows he likes.        Expected Outcome  Short: Attend both education classes to help with diet.  Long: Continue to maintain heart healthy diet.           Nutrition Goals Discharge (Final Nutrition Goals Re-Evaluation): Nutrition Goals Re-Evaluation - 12/20/16 0811      Goals   Current Weight  165 lb (74.8 kg)    Nutrition Goal  Watch what he eats and base diet off of what he has learned in class.    Comment  Mehkai does not want to meet with dietician.  He was able to gleam some insight from the education classes and plans to use that as guideance.  He admits to being a picky eater and wanting to stick with what he knows he likes.     Expected Outcome  Short: Attend both education classes to help with diet.  Long: Continue to maintain heart healthy diet.        Psychosocial: Target Goals: Acknowledge presence or absence of significant depression and/or stress, maximize coping skills, provide positive support system. Participant is able to verbalize types and ability to use techniques and skills needed for reducing stress and depression.   Initial Review & Psychosocial Screening: Initial Psych Review & Screening - 11/18/16 1314      Initial Review   Current issues with  None Identified      Family Dynamics   Good Support System?  Yes wife William Snyder      Barriers   Psychosocial barriers to participate in program  There are no identifiable barriers or psychosocial needs.;The patient should benefit from training in stress management and relaxation.      Screening Interventions   Interventions  Yes;Encouraged to exercise;To provide support and resources with identified psychosocial needs;Provide feedback about the scores to participant    Expected Outcomes  Short Term goal: Utilizing psychosocial counselor, staff and physician to assist with identification of specific  Stressors or current issues interfering with healing process. Setting desired goal for each stressor or current issue identified.;Long Term Goal: Stressors or current issues are controlled or eliminated.;Short Term goal: Identification and review with participant of any Quality of Life or Depression concerns found by scoring the questionnaire.;Long Term goal: The participant improves quality of Life and PHQ9 Scores as seen by post scores and/or verbalization of changes       Quality of Life Scores:  Quality of Life - 01/12/17 0850      Quality of Life Scores   Health/Function Pre  22.93 %    Health/Function Post  22.93 %    Health/Function % Change  0 %    Socioeconomic Pre  29.58 %    Socioeconomic Post  30 %    Socioeconomic % Change   1.42 %    Psych/Spiritual Pre  28.79 %    Psych/Spiritual Post  26.86 %    Psych/Spiritual % Change  -6.7 %    Family Pre  26.4 %    Family Post  25.5 %    Family % Change  -3.41 %  GLOBAL Pre  25.91 %    GLOBAL Post  25.57 %    GLOBAL % Change  -1.31 %       PHQ-9: Recent Review Flowsheet Data    Depression screen [facility] 2/9 01/12/2017 11/18/2016   Decreased Interest 0 0   Down, Depressed, Hopeless 0 0   PHQ - 2 Score 0 0   Altered sleeping 0 0   Tired, decreased energy 1 2   Change in appetite 0 0   Feeling bad or failure about yourself  0 0   Trouble concentrating 0 0   Moving slowly or fidgety/restless 0 0   Suicidal thoughts 0 0   PHQ-9 Score 1 2   Difficult doing work/chores Not difficult at all Somewhat difficult      Interpretation of Total Score  Total Score Depression Severity:  1-4 = Minimal depression, 5-9 = Mild depression, 10-14 = Moderate depression, 15-19 = Moderately severe depression, 20-27 = Severe depression   Psychosocial Evaluation and Intervention: Psychosocial Evaluation - 11/22/16 0950      Psychosocial Evaluation & Interventions   Interventions  Encouraged to exercise with the program and follow exercise  prescription    Comments  Counselor met with William Snyder [facility]) for initial psychosocial evaluation.  He is a 76 year old who had a defibrolator implanted in July and is ordered to this program.  William Snyder has a strong support system with a spouse of 50 years; two daughters locally and active involvement in his local church.  Other than his heart issues, William Snyder has diabetes as well.  He sleeps well and has a good appetite.  William Snyder denies a history of depression or anxiety or any current symptoms and reports his mood is generally positive most of the time.  He has goals to have more energy and feel strong enough to increase his activity back to 5x week at gym and volunteering 2-3x per week.  He will be followed by staff throughout the course of this program.      Expected Outcomes  Nichlos will benefit from consistent exercise to achieve his stated goals.  The educational and psychoeducational components of this program will be helpful as well in understanding and managing his heart disease.    Continue Psychosocial Services   Follow up required by staff       Psychosocial Re-Evaluation: Psychosocial Re-Evaluation    [place] Name 12/20/16 [phone]             Psychosocial Re-Evaluation   Current issues with  Current Stress Concerns       Comments  William Snyder is doing better mentally.  He is feeling more confident and realizes that he has to cope with his disease and may not return to full function.  He is willing to maintain with what he has and make the best of it.  He is sleeping good and still has a great support system in his wife.   And he also continues to stay active at church.  William Snyder feels like the program has really helped him to feel stronger and feel better overall.  At the beginning, he was not able to do much and now he doing more at home.  Also William Snyder has found the education classes to be helpful as well.        Expected Outcomes  Short: Continue to work on buidling strength and stamina.   Long: Maintain postive attitude toward his disease.       Interventions  Stress  management education;Encouraged to attend Cardiac Rehabilitation for the exercise       Continue Psychosocial Services   Follow up required by staff         Initial Review   Source of Stress Concerns  Chronic Illness          Psychosocial Discharge (Final Psychosocial Re-Evaluation): Psychosocial Re-Evaluation - 12/20/16 0814      Psychosocial Re-Evaluation   Current issues with  Current Stress Concerns    Comments  William Snyder is doing better mentally.  He is feeling more confident and realizes that he has to cope with his disease and may not return to full function.  He is willing to maintain with what he has and make the best of it.  He is sleeping good and still has a great support system in his wife.   And he also continues to stay active at church.  William Snyder feels like the program has really helped him to feel stronger and feel better overall.  At the beginning, he was not able to do much and now he doing more at home.  Also William Snyder has found the education classes to be helpful as well.     Expected Outcomes  Short: Continue to work on buidling strength and stamina.  Long: Maintain postive attitude toward his disease.    Interventions  Stress management education;Encouraged to attend Cardiac Rehabilitation for the exercise    Continue Psychosocial Services   Follow up required by staff      Initial Review   Source of Stress Concerns  Chronic Illness       Vocational Rehabilitation: Provide vocational rehab assistance to qualifying candidates.   Vocational Rehab Evaluation & Intervention: Vocational Rehab - 11/18/16 1318      Initial Vocational Rehab Evaluation & Intervention   Assessment shows need for Vocational Rehabilitation  No       Education: Education Goals: Education classes will be provided on a variety of topics geared toward better understanding of heart health and risk factor modification.  Participant will state understanding/return demonstration of topics presented as noted by education test scores.  Learning Barriers/Preferences: Learning Barriers/Preferences - 11/18/16 1317      Learning Barriers/Preferences   Learning Barriers  None    Learning Preferences  None       Education Topics: General Nutrition Guidelines/Fats and Fiber: -Group instruction provided by verbal, written material, models and posters to present the general guidelines for heart healthy nutrition. Gives an explanation and review of dietary fats and fiber.   Cardiac Rehab from 01/17/2017 in [facility] Cardiac and Pulmonary Rehab  Date  12/13/16  Educator  CR  Instruction Review Code  1- Verbalizes Understanding      Controlling Sodium/Reading Food Labels: -Group verbal and written material supporting the discussion of sodium use in heart healthy nutrition. Review and explanation with models, verbal and written materials for utilization of the food label.   Cardiac Rehab from 01/17/2017 in [facility] Cardiac and Pulmonary Rehab  Date  12/20/16  Educator  CR  Instruction Review Code  1- Verbalizes Understanding      Exercise Physiology & Risk Factors: - Group verbal and written instruction with models to review the exercise physiology of the cardiovascular system and associated critical values. Details cardiovascular disease risk factors and the goals associated with each risk factor.   Cardiac Rehab from 01/17/2017 in [facility] Cardiac and Pulmonary Rehab  Date  01/05/17  Educator  [facility]  Instruction Review Code  1- Verbalizes Understanding  Aerobic Exercise & Resistance Training: - Gives group verbal and written discussion on the health impact of inactivity. On the components of aerobic and resistive training programs and the benefits of this training and how to safely progress through these programs.   Cardiac Rehab from 01/17/2017 in [facility] Cardiac and Pulmonary Rehab  Date  12/29/16  Educator  [facility]    Instruction Review Code  1- Verbalizes Understanding      Flexibility, Balance, General Exercise Guidelines: - Provides group verbal and written instruction on the benefits of flexibility and balance training programs. Provides general exercise guidelines with specific guidelines to those with heart or lung disease. Demonstration and skill practice provided.   Cardiac Rehab from 01/17/2017 in [facility] Cardiac and Pulmonary Rehab  Date  01/03/17  Educator  [facility]  Instruction Review Code  1- Verbalizes Understanding      Stress Management: - Provides group verbal and written instruction about the health risks of elevated stress, cause of high stress, and healthy ways to reduce stress.   Cardiac Rehab from 01/17/2017 in [facility] Cardiac and Pulmonary Rehab  Date  01/12/17  Educator  [facility]  Instruction Review Code  1- Verbalizes Understanding      Depression: - Provides group verbal and written instruction on the correlation between heart/lung disease and depressed mood, treatment options, and the stigmas associated with seeking treatment.   Cardiac Rehab from 01/17/2017 in [facility] Cardiac and Pulmonary Rehab  Date  12/15/16  Educator  [facility]  Instruction Review Code  1- Verbalizes Understanding      Anatomy & Physiology of the Heart: - Group verbal and written instruction and models provide basic cardiac anatomy and physiology, with the coronary electrical and arterial systems. Review of: AMI, Angina, Valve disease, Heart Failure, Cardiac Arrhythmia, Pacemakers, and the ICD.   Cardiac Rehab from 01/17/2017 in [facility] Cardiac and Pulmonary Rehab  Date  01/10/17  Educator  CE  Instruction Review Code  1- Verbalizes Understanding      Cardiac Procedures: - Group verbal and written instruction to review commonly prescribed medications for heart disease. Reviews the medication, class of the drug, and side effects. Includes the steps to properly store meds and maintain the prescription regimen. (beta  blockers and nitrates)   Cardiac Rehab from 01/17/2017 in [facility] Cardiac and Pulmonary Rehab  Date  01/17/17  Educator  CE  Instruction Review Code  1- Verbalizes Understanding      Cardiac Medications I: - Group verbal and written instruction to review commonly prescribed medications for heart disease. Reviews the medication, class of the drug, and side effects. Includes the steps to properly store meds and maintain the prescription regimen.   Cardiac Rehab from 01/17/2017 in [facility] Cardiac and Pulmonary Rehab  Date  11/29/16  Educator  [facility]  Instruction Review Code  1- Verbalizes Understanding      Cardiac Medications II: -Group verbal and written instruction to review commonly prescribed medications for heart disease. Reviews the medication, class of the drug, and side effects. (all other drug classes)    Go Sex-Intimacy & Heart Disease, Get SMART - Goal Setting: - Group verbal and written instruction through game format to discuss heart disease and the return to sexual intimacy. Provides group verbal and written material to discuss and apply goal setting through the application of the S.M.A.R.T. Method.   Cardiac Rehab from 01/17/2017 in [facility] Cardiac and Pulmonary Rehab  Date  01/17/17  Educator  CE  Instruction Review Code  1- Verbalizes Understanding  Other Matters of the Heart: - Provides group verbal, written materials and models to describe Heart Failure, Angina, Valve Disease, Peripheral Artery Disease, and Diabetes in the realm of heart disease. Includes description of the disease process and treatment options available to the cardiac patient.   Cardiac Rehab from 01/17/2017 in [facility] Cardiac and Pulmonary Rehab  Date  01/10/17  Educator  CE  Instruction Review Code  1- Verbalizes Understanding      Exercise & Equipment Safety: - Individual verbal instruction and demonstration of equipment use and safety with use of the equipment.   Cardiac Rehab from 01/17/2017 in  [facility] Cardiac and Pulmonary Rehab  Date  11/18/16  Educator  [facility]  Instruction Review Code  1- Verbalizes Understanding      Infection Prevention: - Provides verbal and written material to individual with discussion of infection control including proper hand washing and proper equipment cleaning during exercise session.   Cardiac Rehab from 01/17/2017 in [facility] Cardiac and Pulmonary Rehab  Date  11/18/16  Educator  [facility]  Instruction Review Code  1- Verbalizes Understanding      Falls Prevention: - Provides verbal and written material to individual with discussion of falls prevention and safety.   Cardiac Rehab from 01/17/2017 in [facility] Cardiac and Pulmonary Rehab  Date  11/18/16  Educator  [facility]  Instruction Review Code  1- Verbalizes Understanding      Diabetes: - Individual verbal and written instruction to review signs/symptoms of diabetes, desired ranges of glucose level fasting, after meals and with exercise. Acknowledge that pre and post exercise glucose checks will be done for 3 sessions at entry of program.   Cardiac Rehab from 01/17/2017 in [facility] Cardiac and Pulmonary Rehab  Date  11/18/16  Educator  [facility]  Instruction Review Code  1- Verbalizes Understanding      Other: -Provides group and verbal instruction on various topics (see comments)    Knowledge Questionnaire Score: Knowledge Questionnaire Score - 01/12/17 0849      Knowledge Questionnaire Score   Pre Score  22/28    Post Score  27/28       Core Components/Risk Factors/Patient Goals at Admission: Personal Goals and Risk Factors at Admission - 11/18/16 1312      Core Components/Risk Factors/Patient Goals on Admission    Weight Management  Weight Loss;Yes    Intervention  Weight Management: Develop a combined nutrition and exercise program designed to reach desired caloric intake, while maintaining appropriate intake of nutrient and fiber, sodium and fats, and appropriate energy expenditure required for the weight  goal.;Weight Management: Provide education and appropriate resources to help participant work on and attain dietary goals.;Weight Management/Obesity: Establish reasonable short term and long term weight goals.    Admit Weight  164 lb 8 oz (74.6 kg)    Goal Weight: Short Term  162 lb (73.5 kg)    Goal Weight: Long Term  150 lb (68 kg)    Expected Outcomes  Short Term: Continue to assess and modify interventions until short term weight is achieved;Weight Loss: Understanding of general recommendations for a balanced deficit meal plan, which promotes 1-2 lb weight loss per week and includes a negative energy balance of [phone] kcal/d    Improve shortness of breath with ADL's  Yes    Intervention  Provide education, individualized exercise plan and daily activity instruction to help decrease symptoms of SOB with activities of daily living.    Expected Outcomes  Short Term: Achieves a reduction of symptoms when  performing activities of daily living.    Diabetes  Yes Recent HgbA1c less than 6%    Intervention  Provide education about signs/symptoms and action to take for hypo/hyperglycemia.;Provide education about proper nutrition, including hydration, and aerobic/resistive exercise prescription along with prescribed medications to achieve blood glucose in normal ranges: Fasting glucose 65-99 mg/dL    Expected Outcomes  Short Term: Participant verbalizes understanding of the signs/symptoms and immediate care of hyper/hypoglycemia, proper foot care and importance of medication, aerobic/resistive exercise and nutrition plan for blood glucose control.;Long Term: Attainment of HbA1C < 7%.    Heart Failure  Yes    Intervention  Provide a combined exercise and nutrition program that is supplemented with education, support and counseling about heart failure. Directed toward relieving symptoms such as shortness of breath, decreased exercise tolerance, and extremity edema.    Expected Outcomes  Improve functional  capacity of life;Short term: Attendance in program 2-3 days a week with increased exercise capacity. Reported lower sodium intake. Reported increased fruit and vegetable intake. Reports medication compliance.;Short term: Daily weights obtained and reported for increase. Utilizing diuretic protocols set by physician.;Long term: Adoption of self-care skills and reduction of barriers for early signs and symptoms recognition and intervention leading to self-care maintenance.    Hypertension  Yes    Intervention  Provide education on lifestyle modifcations including regular physical activity/exercise, weight management, moderate sodium restriction and increased consumption of fresh fruit, vegetables, and low fat dairy, alcohol moderation, and smoking cessation.;Monitor prescription use compliance.    Expected Outcomes  Short Term: Continued assessment and intervention until BP is < 140/67m HG in hypertensive participants. < 130/854mHG in hypertensive participants with diabetes, heart failure or chronic kidney disease.;Long Term: Maintenance of blood pressure at goal levels.    Lipids  Yes    Intervention  Provide education and support for participant on nutrition & aerobic/resistive exercise along with prescribed medications to achieve LDL <7029mHDL >59m46m  Expected Outcomes  Short Term: Participant states understanding of desired cholesterol values and is compliant with medications prescribed. Participant is following exercise prescription and nutrition guidelines.;Long Term: Cholesterol controlled with medications as prescribed, with individualized exercise RX and with personalized nutrition plan. Value goals: LDL < 70mg55mL > 40 mg.       Core Components/Risk Factors/Patient Goals Review:  Goals and Risk Factor Review    Row Name 12/20/16 0808             Core Components/Risk Factors/Patient Goals Review   Personal Goals Review  Weight Management/Obesity;Lipids;Hypertension;Diabetes        Review  CharlMalakyeoing well in rehab.  He is down a few a pounds at holding at 165 lbs.  Blood pressures and blood sugars have been good and he is not having any issues with his medications. He has not had any major symptoms with his heart failure.  On occasion he has been short of breath, but says that it is manageable by sitting down and take an extra fluid pill if bad enough.       Expected Outcomes  Short: Continue to work on weight loss.  Long: Continue to manage his heart failure.           Core Components/Risk Factors/Patient Goals at Discharge (Final Review):  Goals and Risk Factor Review - 12/20/16 0808      Core Components/Risk Factors/Patient Goals Review   Personal Goals Review  Weight Management/Obesity;Lipids;Hypertension;Diabetes    Review  CharlDarranoing well in  rehab.  He is down a few a pounds at holding at 165 lbs.  Blood pressures and blood sugars have been good and he is not having any issues with his medications. He has not had any major symptoms with his heart failure.  On occasion he has been short of breath, but says that it is manageable by sitting down and take an extra fluid pill if bad enough.    Expected Outcomes  Short: Continue to work on weight loss.  Long: Continue to manage his heart failure.        ITP Comments: ITP Comments    Row Name 11/18/16 1305 12/08/16 0630 01/05/17 0635 01/17/17 0946     ITP Comments  Medical Review completed today.  INitial ITP created and sent to Dr Sabra Heck for  cosign, review and changes as needed.  Documentation of diagnosis can be found in [facility]   07/22/2016 office visit.  30 day Review. Continue with ITP unless directed changes per Medical Director Review.   30 day review. Continue with ITP unless directed changes per Medical Director review.   Discharge ITP sent and signed by Dr. Sabra Heck.  Discharge Summary routed to PCP and cardiologist.       Comments: Discharge ITP

## 2017-01-28 DIAGNOSIS — H353131 Nonexudative age-related macular degeneration, bilateral, early dry stage: Secondary | ICD-10-CM | POA: Diagnosis not present

## 2017-02-14 DIAGNOSIS — I509 Heart failure, unspecified: Secondary | ICD-10-CM | POA: Diagnosis not present

## 2017-02-14 DIAGNOSIS — Z4502 Encounter for adjustment and management of automatic implantable cardiac defibrillator: Secondary | ICD-10-CM | POA: Diagnosis not present

## 2017-02-17 DIAGNOSIS — I5022 Chronic systolic (congestive) heart failure: Secondary | ICD-10-CM | POA: Diagnosis not present

## 2017-02-17 DIAGNOSIS — I509 Heart failure, unspecified: Secondary | ICD-10-CM | POA: Diagnosis not present

## 2017-03-21 DIAGNOSIS — I5022 Chronic systolic (congestive) heart failure: Secondary | ICD-10-CM | POA: Diagnosis not present

## 2017-03-28 DIAGNOSIS — E119 Type 2 diabetes mellitus without complications: Secondary | ICD-10-CM | POA: Diagnosis not present

## 2017-04-04 DIAGNOSIS — Z0000 Encounter for general adult medical examination without abnormal findings: Secondary | ICD-10-CM | POA: Diagnosis not present

## 2017-04-04 DIAGNOSIS — I420 Dilated cardiomyopathy: Secondary | ICD-10-CM | POA: Diagnosis not present

## 2017-04-04 DIAGNOSIS — E119 Type 2 diabetes mellitus without complications: Secondary | ICD-10-CM | POA: Diagnosis not present

## 2017-04-20 DIAGNOSIS — I5022 Chronic systolic (congestive) heart failure: Secondary | ICD-10-CM | POA: Diagnosis not present

## 2017-05-19 DIAGNOSIS — Z95810 Presence of automatic (implantable) cardiac defibrillator: Secondary | ICD-10-CM | POA: Diagnosis not present

## 2017-05-19 DIAGNOSIS — Z4502 Encounter for adjustment and management of automatic implantable cardiac defibrillator: Secondary | ICD-10-CM | POA: Diagnosis not present

## 2017-05-26 DIAGNOSIS — I5022 Chronic systolic (congestive) heart failure: Secondary | ICD-10-CM | POA: Diagnosis not present

## 2017-05-27 DIAGNOSIS — I5022 Chronic systolic (congestive) heart failure: Secondary | ICD-10-CM | POA: Diagnosis not present

## 2017-07-01 DIAGNOSIS — I5022 Chronic systolic (congestive) heart failure: Secondary | ICD-10-CM | POA: Diagnosis not present

## 2017-07-01 DIAGNOSIS — Z4509 Encounter for adjustment and management of other cardiac device: Secondary | ICD-10-CM | POA: Diagnosis not present

## 2017-07-27 DIAGNOSIS — E119 Type 2 diabetes mellitus without complications: Secondary | ICD-10-CM | POA: Diagnosis not present

## 2017-08-03 DIAGNOSIS — E118 Type 2 diabetes mellitus with unspecified complications: Secondary | ICD-10-CM | POA: Diagnosis not present

## 2017-08-03 DIAGNOSIS — I420 Dilated cardiomyopathy: Secondary | ICD-10-CM | POA: Diagnosis not present

## 2017-08-03 DIAGNOSIS — Z125 Encounter for screening for malignant neoplasm of prostate: Secondary | ICD-10-CM | POA: Diagnosis not present

## 2017-08-03 DIAGNOSIS — E1165 Type 2 diabetes mellitus with hyperglycemia: Secondary | ICD-10-CM | POA: Diagnosis not present

## 2017-08-05 DIAGNOSIS — I5022 Chronic systolic (congestive) heart failure: Secondary | ICD-10-CM | POA: Diagnosis not present

## 2017-08-18 DIAGNOSIS — Z7982 Long term (current) use of aspirin: Secondary | ICD-10-CM | POA: Diagnosis not present

## 2017-08-18 DIAGNOSIS — Z4502 Encounter for adjustment and management of automatic implantable cardiac defibrillator: Secondary | ICD-10-CM | POA: Diagnosis not present

## 2017-08-18 DIAGNOSIS — Z45018 Encounter for adjustment and management of other part of cardiac pacemaker: Secondary | ICD-10-CM | POA: Diagnosis not present

## 2017-08-18 DIAGNOSIS — I509 Heart failure, unspecified: Secondary | ICD-10-CM | POA: Diagnosis not present

## 2017-09-08 DIAGNOSIS — I5022 Chronic systolic (congestive) heart failure: Secondary | ICD-10-CM | POA: Diagnosis not present

## 2017-09-08 DIAGNOSIS — I509 Heart failure, unspecified: Secondary | ICD-10-CM | POA: Diagnosis not present

## 2017-11-17 DIAGNOSIS — R001 Bradycardia, unspecified: Secondary | ICD-10-CM | POA: Diagnosis not present

## 2017-11-17 DIAGNOSIS — Z45018 Encounter for adjustment and management of other part of cardiac pacemaker: Secondary | ICD-10-CM | POA: Diagnosis not present

## 2017-11-17 DIAGNOSIS — Z4502 Encounter for adjustment and management of automatic implantable cardiac defibrillator: Secondary | ICD-10-CM | POA: Diagnosis not present

## 2017-11-21 DIAGNOSIS — I5022 Chronic systolic (congestive) heart failure: Secondary | ICD-10-CM | POA: Diagnosis not present

## 2017-11-21 DIAGNOSIS — Z950 Presence of cardiac pacemaker: Secondary | ICD-10-CM | POA: Diagnosis not present

## 2017-12-07 DIAGNOSIS — E118 Type 2 diabetes mellitus with unspecified complications: Secondary | ICD-10-CM | POA: Diagnosis not present

## 2017-12-07 DIAGNOSIS — Z125 Encounter for screening for malignant neoplasm of prostate: Secondary | ICD-10-CM | POA: Diagnosis not present

## 2017-12-07 DIAGNOSIS — E1165 Type 2 diabetes mellitus with hyperglycemia: Secondary | ICD-10-CM | POA: Diagnosis not present

## 2017-12-14 DIAGNOSIS — Z0000 Encounter for general adult medical examination without abnormal findings: Secondary | ICD-10-CM | POA: Diagnosis not present

## 2017-12-14 DIAGNOSIS — E1169 Type 2 diabetes mellitus with other specified complication: Secondary | ICD-10-CM | POA: Diagnosis not present

## 2017-12-14 DIAGNOSIS — E782 Mixed hyperlipidemia: Secondary | ICD-10-CM | POA: Diagnosis not present

## 2017-12-14 DIAGNOSIS — I420 Dilated cardiomyopathy: Secondary | ICD-10-CM | POA: Diagnosis not present

## 2018-01-06 ENCOUNTER — Ambulatory Visit: Payer: PPO | Admitting: Family

## 2018-01-12 DIAGNOSIS — I5022 Chronic systolic (congestive) heart failure: Secondary | ICD-10-CM | POA: Diagnosis not present

## 2018-01-12 DIAGNOSIS — I509 Heart failure, unspecified: Secondary | ICD-10-CM | POA: Diagnosis not present

## 2018-02-07 DIAGNOSIS — E113293 Type 2 diabetes mellitus with mild nonproliferative diabetic retinopathy without macular edema, bilateral: Secondary | ICD-10-CM | POA: Diagnosis not present

## 2018-02-16 DIAGNOSIS — R001 Bradycardia, unspecified: Secondary | ICD-10-CM | POA: Diagnosis not present

## 2018-02-16 DIAGNOSIS — Z4502 Encounter for adjustment and management of automatic implantable cardiac defibrillator: Secondary | ICD-10-CM | POA: Diagnosis not present

## 2018-02-16 DIAGNOSIS — Z45018 Encounter for adjustment and management of other part of cardiac pacemaker: Secondary | ICD-10-CM | POA: Diagnosis not present

## 2018-04-12 DIAGNOSIS — E782 Mixed hyperlipidemia: Secondary | ICD-10-CM | POA: Diagnosis not present

## 2018-04-12 DIAGNOSIS — E1169 Type 2 diabetes mellitus with other specified complication: Secondary | ICD-10-CM | POA: Diagnosis not present

## 2018-04-19 DIAGNOSIS — C44310 Basal cell carcinoma of skin of unspecified parts of face: Secondary | ICD-10-CM | POA: Diagnosis not present

## 2018-04-19 DIAGNOSIS — E1169 Type 2 diabetes mellitus with other specified complication: Secondary | ICD-10-CM | POA: Diagnosis not present

## 2018-04-19 DIAGNOSIS — E782 Mixed hyperlipidemia: Secondary | ICD-10-CM | POA: Diagnosis not present

## 2018-04-19 DIAGNOSIS — E1121 Type 2 diabetes mellitus with diabetic nephropathy: Secondary | ICD-10-CM | POA: Diagnosis not present

## 2018-04-19 DIAGNOSIS — I420 Dilated cardiomyopathy: Secondary | ICD-10-CM | POA: Diagnosis not present

## 2018-04-19 DIAGNOSIS — Z125 Encounter for screening for malignant neoplasm of prostate: Secondary | ICD-10-CM | POA: Diagnosis not present

## 2018-04-19 DIAGNOSIS — Z0000 Encounter for general adult medical examination without abnormal findings: Secondary | ICD-10-CM | POA: Diagnosis not present

## 2018-04-24 DIAGNOSIS — D485 Neoplasm of uncertain behavior of skin: Secondary | ICD-10-CM | POA: Diagnosis not present

## 2018-04-24 DIAGNOSIS — X32XXXA Exposure to sunlight, initial encounter: Secondary | ICD-10-CM | POA: Diagnosis not present

## 2018-04-24 DIAGNOSIS — D1801 Hemangioma of skin and subcutaneous tissue: Secondary | ICD-10-CM | POA: Diagnosis not present

## 2018-04-24 DIAGNOSIS — C4441 Basal cell carcinoma of skin of scalp and neck: Secondary | ICD-10-CM | POA: Diagnosis not present

## 2018-04-24 DIAGNOSIS — L570 Actinic keratosis: Secondary | ICD-10-CM | POA: Diagnosis not present

## 2018-04-24 DIAGNOSIS — C44329 Squamous cell carcinoma of skin of other parts of face: Secondary | ICD-10-CM | POA: Diagnosis not present

## 2018-05-05 DIAGNOSIS — C44329 Squamous cell carcinoma of skin of other parts of face: Secondary | ICD-10-CM | POA: Diagnosis not present

## 2018-05-15 DIAGNOSIS — I509 Heart failure, unspecified: Secondary | ICD-10-CM | POA: Diagnosis not present

## 2018-05-15 DIAGNOSIS — I5022 Chronic systolic (congestive) heart failure: Secondary | ICD-10-CM | POA: Diagnosis not present

## 2018-05-18 DIAGNOSIS — Z4502 Encounter for adjustment and management of automatic implantable cardiac defibrillator: Secondary | ICD-10-CM | POA: Diagnosis not present

## 2018-05-18 DIAGNOSIS — I429 Cardiomyopathy, unspecified: Secondary | ICD-10-CM | POA: Diagnosis not present

## 2018-06-13 DIAGNOSIS — R339 Retention of urine, unspecified: Secondary | ICD-10-CM | POA: Diagnosis not present

## 2018-06-14 ENCOUNTER — Ambulatory Visit: Payer: Self-pay | Admitting: Urology

## 2018-06-14 ENCOUNTER — Encounter: Payer: Self-pay | Admitting: Urology

## 2018-06-14 ENCOUNTER — Ambulatory Visit: Payer: PPO | Admitting: Urology

## 2018-06-14 ENCOUNTER — Other Ambulatory Visit: Payer: Self-pay

## 2018-06-14 VITALS — BP 146/66 | HR 66 | Ht 70.0 in | Wt 161.0 lb

## 2018-06-14 DIAGNOSIS — R002 Palpitations: Secondary | ICD-10-CM | POA: Diagnosis not present

## 2018-06-14 DIAGNOSIS — R32 Unspecified urinary incontinence: Secondary | ICD-10-CM

## 2018-06-14 DIAGNOSIS — Z8546 Personal history of malignant neoplasm of prostate: Secondary | ICD-10-CM

## 2018-06-14 DIAGNOSIS — I480 Paroxysmal atrial fibrillation: Secondary | ICD-10-CM | POA: Diagnosis not present

## 2018-06-14 DIAGNOSIS — N393 Stress incontinence (female) (male): Secondary | ICD-10-CM | POA: Diagnosis not present

## 2018-06-14 DIAGNOSIS — I10 Essential (primary) hypertension: Secondary | ICD-10-CM | POA: Diagnosis not present

## 2018-06-14 LAB — BLADDER SCAN AMB NON-IMAGING

## 2018-06-14 NOTE — Progress Notes (Signed)
06/14/2018 11:03 AM   William Snyder 22-Sep-1940 [id number]  Referring provider: Rusty Aus, MD [facility] [place], [place] [address]  Chief Complaint  Patient presents with  . New Patient (Initial Visit)    AUS problems Hx of prostate cancer    HPI: 78 year old male with personal history of prostate cancer status post open prostatectomy in 2006 by Dr. Bernardo Heater who presents today with issues with his artificial urinary sphincter.  He reports that after his prostate surgery, he was grossly incontinent.  He was sent to [facility] and had a placement of artificial urinary sphincter in 2007.  It is been functioning exceptionally well until that 6 weeks ago when he started to have worsened stress incontinence.  He is now wearing a condom cath in addition to his sphincter for fairly significant leakage during the day.  He reports that he thinks that his cough is still cycling, he is fairly dry overnight but in the daytime, he leaks.  When he presses the cough, he is able to void spontaneously with increased flow which stops once his bladder is empty.  Despite this, he leaks in between cycling his cough which is bothersome to him.  He denies any redness, swelling, injuries leading up to this.   PMH: Past Medical History:  Diagnosis Date  . Diabetes ([place])    Takes Metformin, Glipizide and Januvia  . High cholesterol   . Hypertension   . Prostate cancer [facility]) 2006   Prostatectomy.    Surgical History: Past Surgical History:  Procedure Laterality Date  . CARDIAC CATHETERIZATION Bilateral 02/13/2016   Procedure: Right/Left Heart Cath and Coronary Angiography;  Surgeon: Teodoro Spray, MD;  Location: [place] CV [facility];  Service: Cardiovascular;  Laterality: Bilateral;  . CHOLECYSTECTOMY    . HERNIA REPAIR    . PROSTATECTOMY    . URINARY SPHINCTER IMPLANT      Home Medications:  Allergies as of 06/14/2018   No Known Allergies      Medication List       Accurate as of June 14, 2018 11:03 AM. Always use your most recent med list.        aspirin 81 MG tablet Take 81 mg by mouth daily.   furosemide 20 MG tablet Commonly known as:  LASIX Take 20 mg by mouth 2 (two) times daily.   glimepiride 4 MG tablet Commonly known as:  AMARYL Take 4 mg by mouth daily.   magnesium oxide 400 MG tablet Commonly known as:  MAG-OX Take by mouth.   metFORMIN 500 MG tablet Commonly known as:  GLUCOPHAGE Take by mouth.   metoprolol succinate 50 MG 24 hr tablet Commonly known as:  TOPROL-XL 3 (three) times daily.   omeprazole 20 MG capsule Commonly known as:  PRILOSEC Take 20 mg by mouth daily.   potassium chloride 10 MEQ tablet Commonly known as:  K-DUR Take 10 mEq by mouth 2 (two) times daily.   sacubitril-valsartan 24-26 MG Commonly known as:  ENTRESTO Take by mouth.   simvastatin 40 MG tablet Commonly known as:  ZOCOR Take 40 mg by mouth daily.   sitaGLIPtin 50 MG tablet Commonly known as:  JANUVIA Take by mouth.   spironolactone 25 MG tablet Commonly known as:  ALDACTONE Take 25 mg by mouth daily.       Allergies: No Known Allergies  Family History: No family history on file.  Social History:  reports that he has never smoked. He  has never used smokeless tobacco. He reports that he does not drink alcohol or use drugs.  ROS: UROLOGY Frequent Urination?: No Hard to postpone urination?: No Burning/pain with urination?: No Get up at night to urinate?: No Leakage of urine?: Yes Urine stream starts and stops?: No Trouble starting stream?: No Do you have to strain to urinate?: No Blood in urine?: No Urinary tract infection?: No Sexually transmitted disease?: No Injury to kidneys or bladder?: No Painful intercourse?: No Weak stream?: No Erection problems?: No Penile pain?: No  Gastrointestinal Nausea?: No Vomiting?: No Indigestion/heartburn?: No Diarrhea?: No Constipation?: No   Constitutional Fever: No Night sweats?: No Weight loss?: No Fatigue?: No  Skin Skin rash/lesions?: No Itching?: No  Eyes Blurred vision?: No Double vision?: No  Ears/Nose/Throat Sore throat?: No Sinus problems?: No  Hematologic/Lymphatic Swollen glands?: No Easy bruising?: No  Cardiovascular Leg swelling?: No Chest pain?: No  Respiratory Cough?: No Shortness of breath?: No  Endocrine Excessive thirst?: No  Musculoskeletal Back pain?: No Joint pain?: No  Neurological Headaches?: No Dizziness?: No  Psychologic Depression?: No Anxiety?: No  Physical Exam: BP (!) 146/66   Pulse 66   Ht 5\' 10"  (1.778 m)   Wt 161 lb (73 kg)   BMI 23.10 kg/m   Constitutional:  Alert and oriented, No acute distress. HEENT: [place] AT, moist mucus membranes.  Trachea midline, no masses. Cardiovascular: No clubbing, cyanosis, or edema. Respiratory: Normal respiratory effort, no increased work of breathing. GI: Abdomen is soft, nontender, nondistended, no abdominal masses.  Lower abdominal midline incision appreciated. GU: Condom cath which has clear yellow urine in the bag.  Scrotum is unremarkable with AUS pump located in the midline posterior scrotum.  I was able to cycle the cough and with depression of the bulb, he did have a flow of urine into his condom cath.  This subsided as the bulb refilled.  No tenderness around the device. Skin: No rashes, bruises or suspicious lesions. Neurologic: Grossly intact, no focal deficits, moving all 4 extremities. Psychiatric: Normal mood and affect.  Laboratory Data: PSA less than 0.01 on 11/2017  Results for orders placed or performed in visit on 06/14/18  BLADDER SCAN AMB NON-IMAGING  Result Value Ref Range   Scan Result 45ml      Assessment & Plan:    1. Stress incontinence of urine AUS device seems to be in good position and in fact does cycle appropriately and appears to be activated I am suspicious that he has had some  urethral atrophy or perhaps even loss of a small amount of fluid within the device leading to decreased cuff pressure He may need a cuff revision or perhaps even a tandem cuff I have recommended referral to tertiary care center, either [facility] or [facility] for further evaluation of revision surgery Patient reports today that [facility] is out of his network and would prefer [facility] In the meantime, we discussed alternative basis including penile clamp which he declined today, satisfied with condom cath until he can be evaluated - BLADDER SCAN AMB NON-IMAGING - Ambulatory referral to Urology  2. History of prostate cancer PSA remains undetectable, no evidence of disease   F/u as needed  Hollice Espy, MD  [place] [address] [place], [place] [address] [phone]

## 2018-07-18 DIAGNOSIS — N393 Stress incontinence (female) (male): Secondary | ICD-10-CM | POA: Diagnosis not present

## 2018-07-18 DIAGNOSIS — N3945 Continuous leakage: Secondary | ICD-10-CM | POA: Diagnosis not present

## 2018-08-14 DIAGNOSIS — C4441 Basal cell carcinoma of skin of scalp and neck: Secondary | ICD-10-CM | POA: Diagnosis not present

## 2018-08-17 DIAGNOSIS — Z45018 Encounter for adjustment and management of other part of cardiac pacemaker: Secondary | ICD-10-CM | POA: Diagnosis not present

## 2018-08-21 DIAGNOSIS — Z960 Presence of urogenital implants: Secondary | ICD-10-CM | POA: Diagnosis not present

## 2018-08-21 DIAGNOSIS — N3289 Other specified disorders of bladder: Secondary | ICD-10-CM | POA: Diagnosis not present

## 2018-08-21 DIAGNOSIS — N471 Phimosis: Secondary | ICD-10-CM | POA: Diagnosis not present

## 2018-08-21 DIAGNOSIS — N390 Urinary tract infection, site not specified: Secondary | ICD-10-CM | POA: Diagnosis not present

## 2018-08-21 DIAGNOSIS — I5022 Chronic systolic (congestive) heart failure: Secondary | ICD-10-CM | POA: Diagnosis not present

## 2018-08-21 DIAGNOSIS — N320 Bladder-neck obstruction: Secondary | ICD-10-CM | POA: Diagnosis not present

## 2018-08-21 DIAGNOSIS — C61 Malignant neoplasm of prostate: Secondary | ICD-10-CM | POA: Diagnosis not present

## 2018-08-21 DIAGNOSIS — Z8546 Personal history of malignant neoplasm of prostate: Secondary | ICD-10-CM | POA: Diagnosis not present

## 2018-08-21 DIAGNOSIS — R32 Unspecified urinary incontinence: Secondary | ICD-10-CM | POA: Diagnosis not present

## 2018-08-21 DIAGNOSIS — I429 Cardiomyopathy, unspecified: Secondary | ICD-10-CM | POA: Diagnosis not present

## 2018-09-12 DIAGNOSIS — R339 Retention of urine, unspecified: Secondary | ICD-10-CM | POA: Diagnosis not present

## 2018-09-12 IMAGING — CT CT ANGIO CHEST
2 of 6 series · 19 of 46 positions shown · IV contrast (APPLIED)
Comparison: 12/04/2014

CLINICAL DATA: Shortness of breath over the last 8 weeks.
Difficulty sleeping. Chest congestion.

EXAM:
CT ANGIOGRAPHY CHEST WITH CONTRAST
TECHNIQUE: Multidetector CT imaging of the chest was performed using the
standard protocol during bolus administration of intravenous
contrast. Multiplanar CT image reconstructions and MIPs were
obtained to evaluate the vascular anatomy.
CONTRAST:  75 cc Isovue 370

[Series 5: thins · axial · 0.80mm/px · z∈[-504,-189]mm · 17 of 345 slices shown]
[im 15/345  lung]
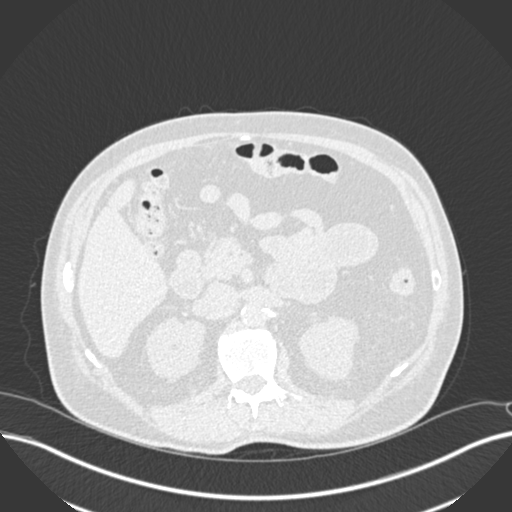
[im 30/345  soft-tissue]
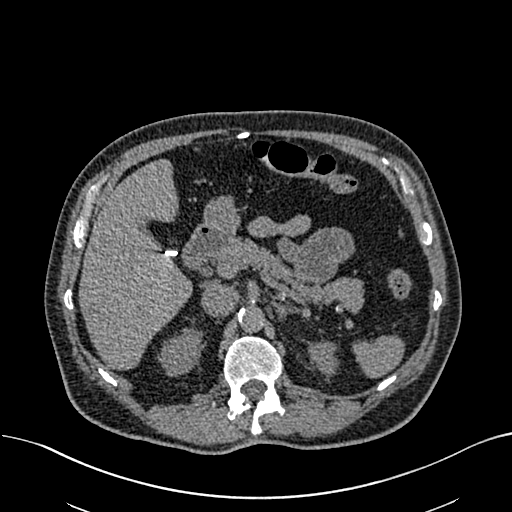
[im 60/345  lung]
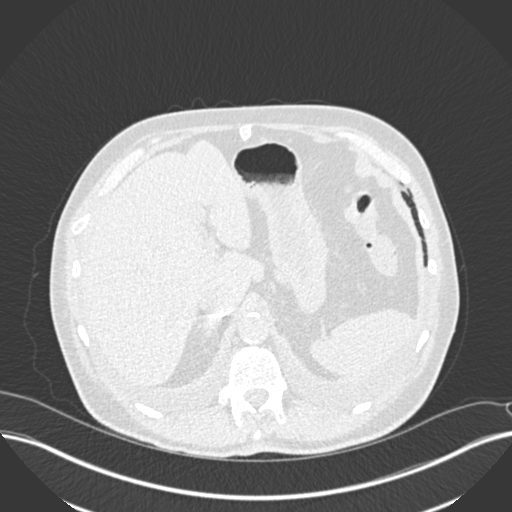
[im 75/345  soft-tissue]
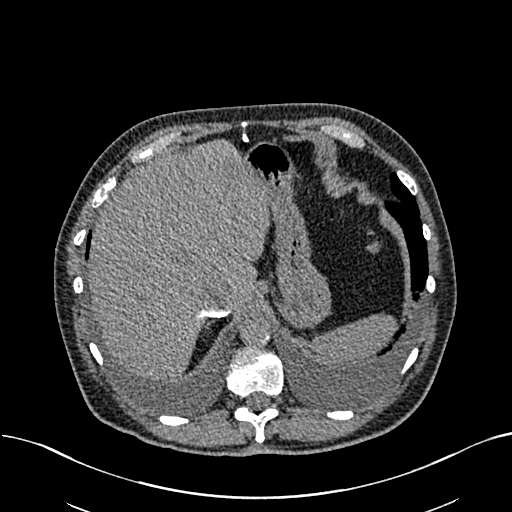
[im 90/345  lung]
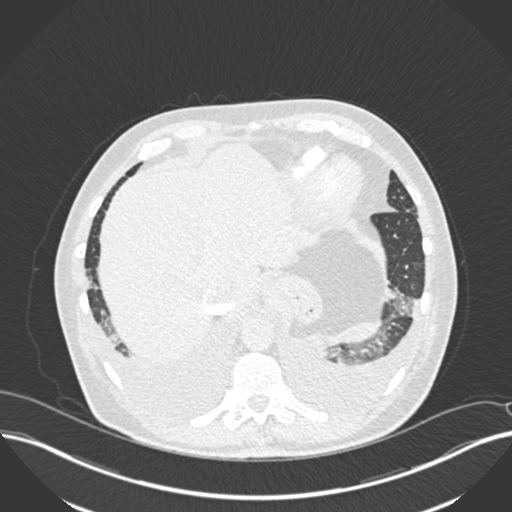
[im 120/345  soft-tissue]
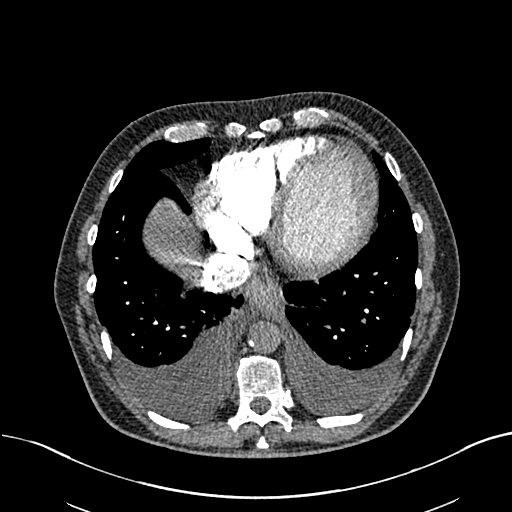
[im 135/345  lung]
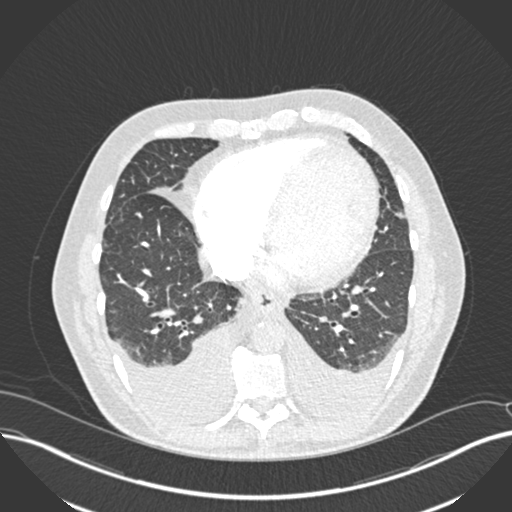
[im 150/345  soft-tissue]
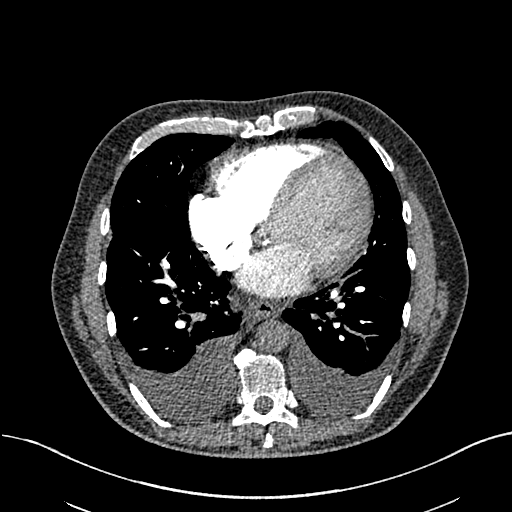
[im 180/345  lung]
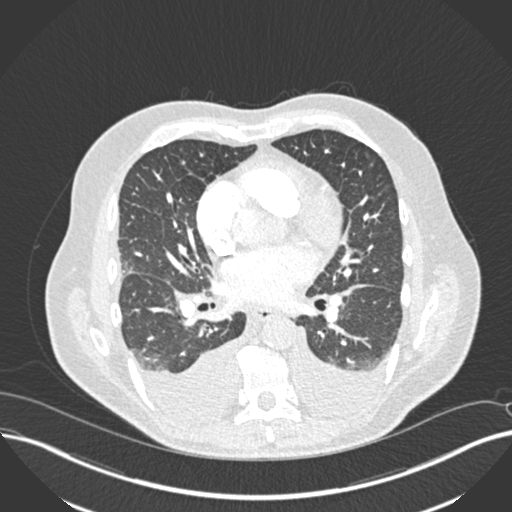
[im 195/345  soft-tissue]
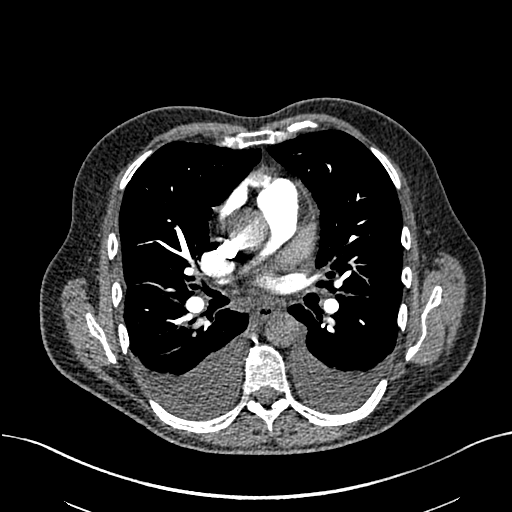
[im 210/345  lung]
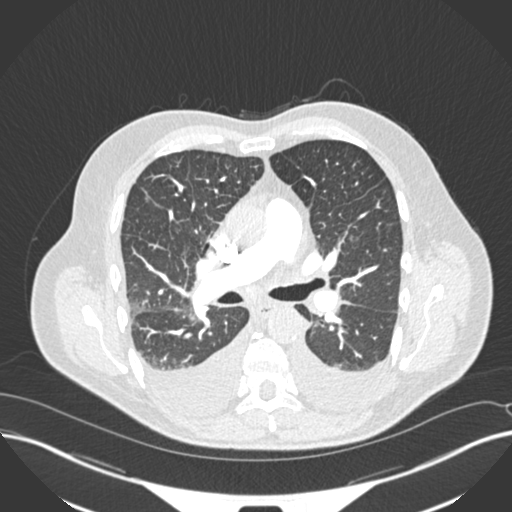
[im 225/345  soft-tissue]
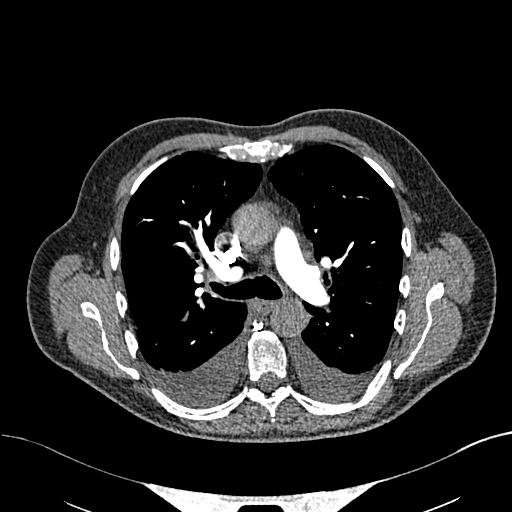
[im 255/345  lung]
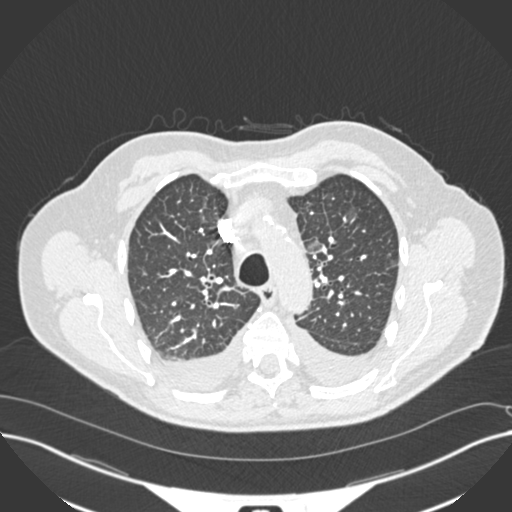
[im 270/345  soft-tissue]
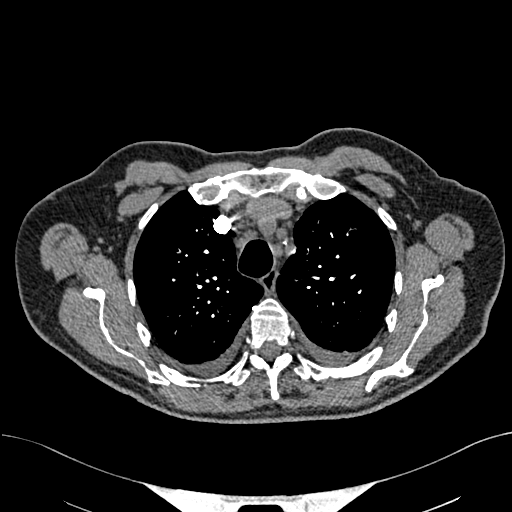
[im 285/345  lung]
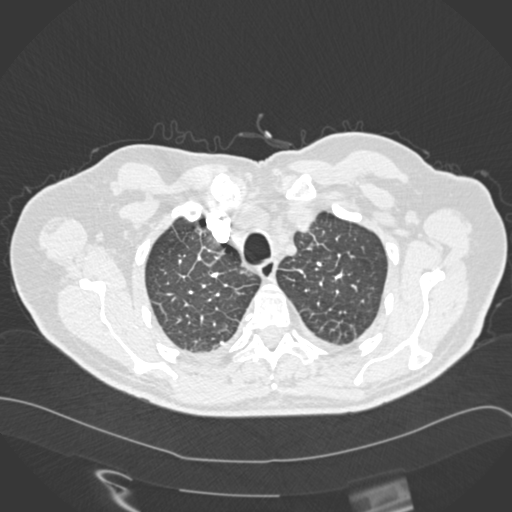
[im 315/345  soft-tissue]
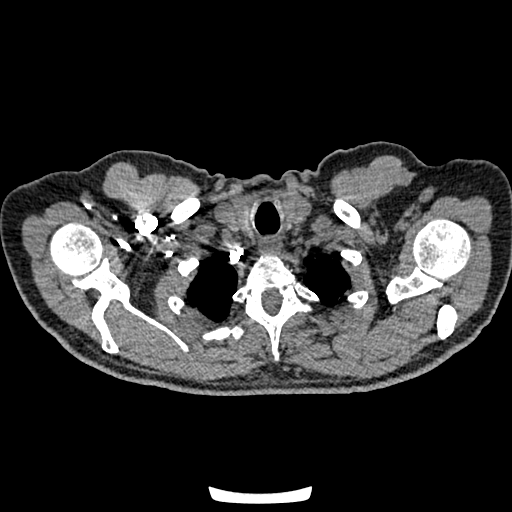
[im 330/345  lung]
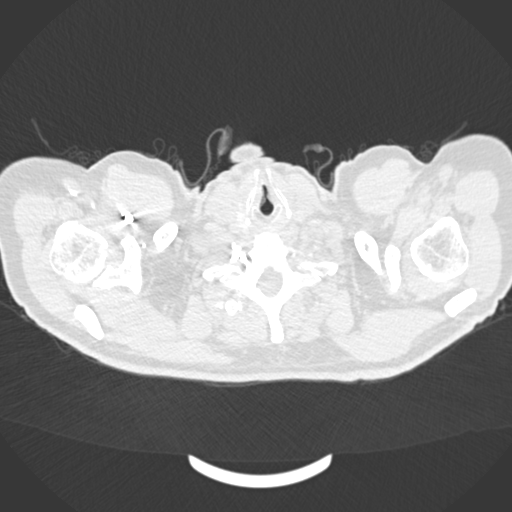

[Series 7: coronal mpr · coronal · 0.68mm/px · 2 of 106 slices shown]
[im 36/106  soft-tissue]
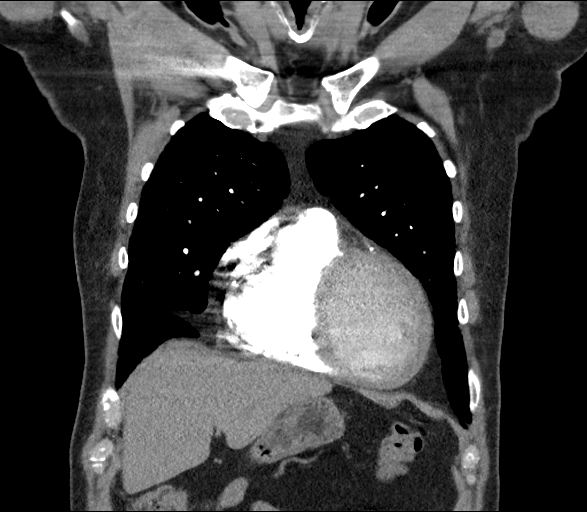
[im 71/106  soft-tissue]
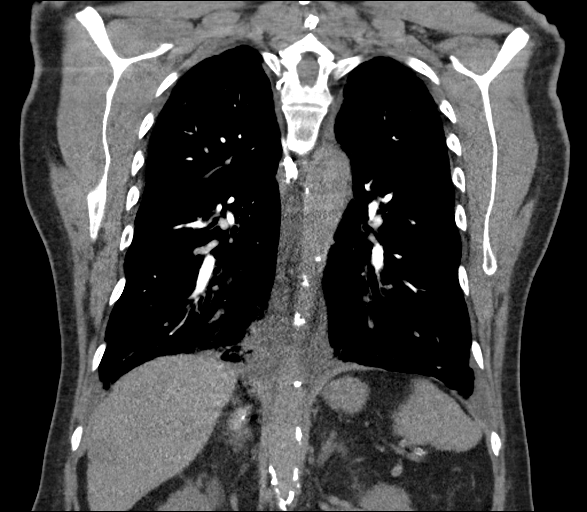

[19 of 46 positions shown; findings below may reference images not displayed]

FINDINGS: Cardiovascular: Pulmonary arterial opacification is good. There are
no pulmonary emboli. There is aortic atherosclerosis. The aorta is
not opacified, but there is no sign of aneurysm or dissection. There
is coronary artery calcification. Tiny amount of pericardial fluid.

Mediastinum/Nodes: No mass or lymphadenopathy.

Lungs/Pleura: Moderate size bilateral effusions layering
dependently. Dependent pulmonary atelectasis. Pattern of
interstitial prominence likely to indicate interstitial edema.

Upper Abdomen: Negative.  Previous cholecystectomy.

Musculoskeletal: Negative

Review of the MIP images confirms the above findings.
IMPRESSION: Probable congestive heart failure. Bilateral pleural effusions
layering dependently with dependent pulmonary atelectasis. Mild
interstitial edema pattern.

No pulmonary emboli.

Aortic atherosclerosis.  Coronary artery calcification.

## 2018-09-15 DIAGNOSIS — N393 Stress incontinence (female) (male): Secondary | ICD-10-CM | POA: Diagnosis not present

## 2018-09-15 DIAGNOSIS — I5022 Chronic systolic (congestive) heart failure: Secondary | ICD-10-CM | POA: Diagnosis not present

## 2018-09-15 DIAGNOSIS — Z95810 Presence of automatic (implantable) cardiac defibrillator: Secondary | ICD-10-CM | POA: Diagnosis not present

## 2018-09-15 DIAGNOSIS — K219 Gastro-esophageal reflux disease without esophagitis: Secondary | ICD-10-CM | POA: Diagnosis not present

## 2018-09-15 DIAGNOSIS — E119 Type 2 diabetes mellitus without complications: Secondary | ICD-10-CM | POA: Diagnosis not present

## 2018-09-15 DIAGNOSIS — I130 Hypertensive heart and chronic kidney disease with heart failure and stage 1 through stage 4 chronic kidney disease, or unspecified chronic kidney disease: Secondary | ICD-10-CM | POA: Diagnosis not present

## 2018-09-15 DIAGNOSIS — I428 Other cardiomyopathies: Secondary | ICD-10-CM | POA: Diagnosis not present

## 2018-09-15 DIAGNOSIS — Z01818 Encounter for other preprocedural examination: Secondary | ICD-10-CM | POA: Diagnosis not present

## 2018-09-15 DIAGNOSIS — N182 Chronic kidney disease, stage 2 (mild): Secondary | ICD-10-CM | POA: Diagnosis not present

## 2018-09-18 DIAGNOSIS — Z1159 Encounter for screening for other viral diseases: Secondary | ICD-10-CM | POA: Diagnosis not present

## 2018-09-20 DIAGNOSIS — C61 Malignant neoplasm of prostate: Secondary | ICD-10-CM | POA: Diagnosis not present

## 2018-09-20 DIAGNOSIS — I429 Cardiomyopathy, unspecified: Secondary | ICD-10-CM | POA: Diagnosis not present

## 2018-09-20 DIAGNOSIS — N3289 Other specified disorders of bladder: Secondary | ICD-10-CM | POA: Diagnosis not present

## 2018-09-20 DIAGNOSIS — Z95810 Presence of automatic (implantable) cardiac defibrillator: Secondary | ICD-10-CM | POA: Diagnosis not present

## 2018-09-20 DIAGNOSIS — Z9641 Presence of insulin pump (external) (internal): Secondary | ICD-10-CM | POA: Diagnosis not present

## 2018-09-20 DIAGNOSIS — M4802 Spinal stenosis, cervical region: Secondary | ICD-10-CM | POA: Diagnosis not present

## 2018-09-20 DIAGNOSIS — N471 Phimosis: Secondary | ICD-10-CM | POA: Diagnosis not present

## 2018-09-20 DIAGNOSIS — Z87440 Personal history of urinary (tract) infections: Secondary | ICD-10-CM | POA: Diagnosis not present

## 2018-09-20 DIAGNOSIS — I130 Hypertensive heart and chronic kidney disease with heart failure and stage 1 through stage 4 chronic kidney disease, or unspecified chronic kidney disease: Secondary | ICD-10-CM | POA: Diagnosis not present

## 2018-09-20 DIAGNOSIS — N320 Bladder-neck obstruction: Secondary | ICD-10-CM | POA: Diagnosis not present

## 2018-09-20 DIAGNOSIS — N393 Stress incontinence (female) (male): Secondary | ICD-10-CM | POA: Diagnosis not present

## 2018-09-20 DIAGNOSIS — E1122 Type 2 diabetes mellitus with diabetic chronic kidney disease: Secondary | ICD-10-CM | POA: Diagnosis not present

## 2018-09-20 DIAGNOSIS — I2510 Atherosclerotic heart disease of native coronary artery without angina pectoris: Secondary | ICD-10-CM | POA: Diagnosis not present

## 2018-09-20 DIAGNOSIS — I5022 Chronic systolic (congestive) heart failure: Secondary | ICD-10-CM | POA: Diagnosis not present

## 2018-09-20 DIAGNOSIS — N182 Chronic kidney disease, stage 2 (mild): Secondary | ICD-10-CM | POA: Diagnosis not present

## 2018-09-20 DIAGNOSIS — N390 Urinary tract infection, site not specified: Secondary | ICD-10-CM | POA: Diagnosis not present

## 2018-09-22 DIAGNOSIS — N393 Stress incontinence (female) (male): Secondary | ICD-10-CM | POA: Diagnosis not present

## 2018-10-23 DIAGNOSIS — R32 Unspecified urinary incontinence: Secondary | ICD-10-CM | POA: Diagnosis not present

## 2018-10-23 DIAGNOSIS — R300 Dysuria: Secondary | ICD-10-CM | POA: Diagnosis not present

## 2018-10-23 DIAGNOSIS — Z8546 Personal history of malignant neoplasm of prostate: Secondary | ICD-10-CM | POA: Diagnosis not present

## 2018-10-23 DIAGNOSIS — N393 Stress incontinence (female) (male): Secondary | ICD-10-CM | POA: Diagnosis not present

## 2018-10-23 DIAGNOSIS — N481 Balanitis: Secondary | ICD-10-CM | POA: Diagnosis not present

## 2018-11-16 DIAGNOSIS — I429 Cardiomyopathy, unspecified: Secondary | ICD-10-CM | POA: Diagnosis not present

## 2018-11-16 DIAGNOSIS — Z4502 Encounter for adjustment and management of automatic implantable cardiac defibrillator: Secondary | ICD-10-CM | POA: Diagnosis not present

## 2018-11-28 DIAGNOSIS — Z85828 Personal history of other malignant neoplasm of skin: Secondary | ICD-10-CM | POA: Diagnosis not present

## 2018-11-28 DIAGNOSIS — D1801 Hemangioma of skin and subcutaneous tissue: Secondary | ICD-10-CM | POA: Diagnosis not present

## 2018-11-28 DIAGNOSIS — Z08 Encounter for follow-up examination after completed treatment for malignant neoplasm: Secondary | ICD-10-CM | POA: Diagnosis not present

## 2018-11-28 DIAGNOSIS — L570 Actinic keratosis: Secondary | ICD-10-CM | POA: Diagnosis not present

## 2018-11-28 DIAGNOSIS — X32XXXA Exposure to sunlight, initial encounter: Secondary | ICD-10-CM | POA: Diagnosis not present

## 2018-12-12 DIAGNOSIS — R339 Retention of urine, unspecified: Secondary | ICD-10-CM | POA: Diagnosis not present

## 2018-12-13 DIAGNOSIS — E1169 Type 2 diabetes mellitus with other specified complication: Secondary | ICD-10-CM | POA: Diagnosis not present

## 2018-12-13 DIAGNOSIS — Z125 Encounter for screening for malignant neoplasm of prostate: Secondary | ICD-10-CM | POA: Diagnosis not present

## 2018-12-13 DIAGNOSIS — E782 Mixed hyperlipidemia: Secondary | ICD-10-CM | POA: Diagnosis not present

## 2018-12-19 DIAGNOSIS — I509 Heart failure, unspecified: Secondary | ICD-10-CM | POA: Diagnosis not present

## 2018-12-19 DIAGNOSIS — I5022 Chronic systolic (congestive) heart failure: Secondary | ICD-10-CM | POA: Diagnosis not present

## 2018-12-19 DIAGNOSIS — E119 Type 2 diabetes mellitus without complications: Secondary | ICD-10-CM | POA: Diagnosis not present

## 2018-12-19 DIAGNOSIS — N182 Chronic kidney disease, stage 2 (mild): Secondary | ICD-10-CM | POA: Diagnosis not present

## 2018-12-19 DIAGNOSIS — I10 Essential (primary) hypertension: Secondary | ICD-10-CM | POA: Diagnosis not present

## 2018-12-19 DIAGNOSIS — Z8546 Personal history of malignant neoplasm of prostate: Secondary | ICD-10-CM | POA: Diagnosis not present

## 2018-12-21 DIAGNOSIS — E7800 Pure hypercholesterolemia, unspecified: Secondary | ICD-10-CM | POA: Diagnosis not present

## 2018-12-21 DIAGNOSIS — E1165 Type 2 diabetes mellitus with hyperglycemia: Secondary | ICD-10-CM | POA: Diagnosis not present

## 2018-12-21 DIAGNOSIS — Z0000 Encounter for general adult medical examination without abnormal findings: Secondary | ICD-10-CM | POA: Diagnosis not present

## 2018-12-21 DIAGNOSIS — E782 Mixed hyperlipidemia: Secondary | ICD-10-CM | POA: Diagnosis not present

## 2018-12-21 DIAGNOSIS — E1169 Type 2 diabetes mellitus with other specified complication: Secondary | ICD-10-CM | POA: Diagnosis not present

## 2018-12-25 DIAGNOSIS — R399 Unspecified symptoms and signs involving the genitourinary system: Secondary | ICD-10-CM | POA: Diagnosis not present

## 2018-12-25 DIAGNOSIS — N393 Stress incontinence (female) (male): Secondary | ICD-10-CM | POA: Diagnosis not present

## 2018-12-25 DIAGNOSIS — N320 Bladder-neck obstruction: Secondary | ICD-10-CM | POA: Diagnosis not present

## 2019-01-05 DIAGNOSIS — Z01818 Encounter for other preprocedural examination: Secondary | ICD-10-CM | POA: Diagnosis not present

## 2019-01-05 DIAGNOSIS — N393 Stress incontinence (female) (male): Secondary | ICD-10-CM | POA: Diagnosis not present

## 2019-01-05 DIAGNOSIS — R399 Unspecified symptoms and signs involving the genitourinary system: Secondary | ICD-10-CM | POA: Diagnosis not present

## 2019-01-05 DIAGNOSIS — N3289 Other specified disorders of bladder: Secondary | ICD-10-CM | POA: Diagnosis not present

## 2019-01-05 DIAGNOSIS — R9431 Abnormal electrocardiogram [ECG] [EKG]: Secondary | ICD-10-CM | POA: Diagnosis not present

## 2019-01-17 DIAGNOSIS — Z20828 Contact with and (suspected) exposure to other viral communicable diseases: Secondary | ICD-10-CM | POA: Diagnosis not present

## 2019-01-17 DIAGNOSIS — Z01812 Encounter for preprocedural laboratory examination: Secondary | ICD-10-CM | POA: Diagnosis not present

## 2019-01-29 DIAGNOSIS — R931 Abnormal findings on diagnostic imaging of heart and coronary circulation: Secondary | ICD-10-CM | POA: Diagnosis not present

## 2019-01-29 DIAGNOSIS — I5022 Chronic systolic (congestive) heart failure: Secondary | ICD-10-CM | POA: Diagnosis not present

## 2019-02-12 DIAGNOSIS — H353131 Nonexudative age-related macular degeneration, bilateral, early dry stage: Secondary | ICD-10-CM | POA: Diagnosis not present

## 2019-02-15 DIAGNOSIS — Z4502 Encounter for adjustment and management of automatic implantable cardiac defibrillator: Secondary | ICD-10-CM | POA: Diagnosis not present

## 2019-02-16 DIAGNOSIS — K219 Gastro-esophageal reflux disease without esophagitis: Secondary | ICD-10-CM | POA: Diagnosis not present

## 2019-02-16 DIAGNOSIS — N393 Stress incontinence (female) (male): Secondary | ICD-10-CM | POA: Diagnosis not present

## 2019-02-16 DIAGNOSIS — I420 Dilated cardiomyopathy: Secondary | ICD-10-CM | POA: Diagnosis not present

## 2019-02-16 DIAGNOSIS — E1169 Type 2 diabetes mellitus with other specified complication: Secondary | ICD-10-CM | POA: Diagnosis not present

## 2019-02-16 DIAGNOSIS — N182 Chronic kidney disease, stage 2 (mild): Secondary | ICD-10-CM | POA: Diagnosis not present

## 2019-02-16 DIAGNOSIS — Z01818 Encounter for other preprocedural examination: Secondary | ICD-10-CM | POA: Diagnosis not present

## 2019-02-16 DIAGNOSIS — I10 Essential (primary) hypertension: Secondary | ICD-10-CM | POA: Diagnosis not present

## 2019-02-16 DIAGNOSIS — Z95810 Presence of automatic (implantable) cardiac defibrillator: Secondary | ICD-10-CM | POA: Diagnosis not present

## 2019-02-27 DIAGNOSIS — Z01818 Encounter for other preprocedural examination: Secondary | ICD-10-CM | POA: Diagnosis not present

## 2019-03-01 DIAGNOSIS — I2510 Atherosclerotic heart disease of native coronary artery without angina pectoris: Secondary | ICD-10-CM | POA: Diagnosis not present

## 2019-03-01 DIAGNOSIS — T8389XA Other specified complication of genitourinary prosthetic devices, implants and grafts, initial encounter: Secondary | ICD-10-CM | POA: Diagnosis not present

## 2019-03-01 DIAGNOSIS — Z79899 Other long term (current) drug therapy: Secondary | ICD-10-CM | POA: Diagnosis not present

## 2019-03-01 DIAGNOSIS — N393 Stress incontinence (female) (male): Secondary | ICD-10-CM | POA: Diagnosis not present

## 2019-03-01 DIAGNOSIS — I509 Heart failure, unspecified: Secondary | ICD-10-CM | POA: Diagnosis not present

## 2019-03-01 DIAGNOSIS — K219 Gastro-esophageal reflux disease without esophagitis: Secondary | ICD-10-CM | POA: Diagnosis not present

## 2019-03-01 DIAGNOSIS — E119 Type 2 diabetes mellitus without complications: Secondary | ICD-10-CM | POA: Diagnosis not present

## 2019-03-01 DIAGNOSIS — Z960 Presence of urogenital implants: Secondary | ICD-10-CM | POA: Diagnosis not present

## 2019-03-01 DIAGNOSIS — N182 Chronic kidney disease, stage 2 (mild): Secondary | ICD-10-CM | POA: Diagnosis not present

## 2019-03-01 DIAGNOSIS — N368 Other specified disorders of urethra: Secondary | ICD-10-CM | POA: Diagnosis not present

## 2019-03-01 DIAGNOSIS — I130 Hypertensive heart and chronic kidney disease with heart failure and stage 1 through stage 4 chronic kidney disease, or unspecified chronic kidney disease: Secondary | ICD-10-CM | POA: Diagnosis not present

## 2019-03-01 DIAGNOSIS — E1122 Type 2 diabetes mellitus with diabetic chronic kidney disease: Secondary | ICD-10-CM | POA: Diagnosis not present

## 2019-03-01 DIAGNOSIS — I420 Dilated cardiomyopathy: Secondary | ICD-10-CM | POA: Diagnosis not present

## 2019-03-01 DIAGNOSIS — Z7984 Long term (current) use of oral hypoglycemic drugs: Secondary | ICD-10-CM | POA: Diagnosis not present

## 2019-03-01 DIAGNOSIS — Z9079 Acquired absence of other genital organ(s): Secondary | ICD-10-CM | POA: Diagnosis not present

## 2019-03-14 DIAGNOSIS — R339 Retention of urine, unspecified: Secondary | ICD-10-CM | POA: Diagnosis not present

## 2019-04-18 DIAGNOSIS — Z09 Encounter for follow-up examination after completed treatment for conditions other than malignant neoplasm: Secondary | ICD-10-CM | POA: Diagnosis not present

## 2019-04-18 DIAGNOSIS — N393 Stress incontinence (female) (male): Secondary | ICD-10-CM | POA: Diagnosis not present

## 2019-05-17 DIAGNOSIS — I5022 Chronic systolic (congestive) heart failure: Secondary | ICD-10-CM | POA: Diagnosis not present

## 2019-05-17 DIAGNOSIS — Z4502 Encounter for adjustment and management of automatic implantable cardiac defibrillator: Secondary | ICD-10-CM | POA: Diagnosis not present

## 2019-06-13 DIAGNOSIS — I5022 Chronic systolic (congestive) heart failure: Secondary | ICD-10-CM | POA: Diagnosis not present

## 2019-06-13 DIAGNOSIS — I10 Essential (primary) hypertension: Secondary | ICD-10-CM | POA: Diagnosis not present

## 2019-06-13 DIAGNOSIS — E119 Type 2 diabetes mellitus without complications: Secondary | ICD-10-CM | POA: Diagnosis not present

## 2019-06-13 DIAGNOSIS — N182 Chronic kidney disease, stage 2 (mild): Secondary | ICD-10-CM | POA: Diagnosis not present

## 2019-06-13 DIAGNOSIS — I509 Heart failure, unspecified: Secondary | ICD-10-CM | POA: Diagnosis not present

## 2019-06-28 DIAGNOSIS — E1169 Type 2 diabetes mellitus with other specified complication: Secondary | ICD-10-CM | POA: Diagnosis not present

## 2019-06-28 DIAGNOSIS — E782 Mixed hyperlipidemia: Secondary | ICD-10-CM | POA: Diagnosis not present

## 2019-07-05 DIAGNOSIS — I420 Dilated cardiomyopathy: Secondary | ICD-10-CM | POA: Diagnosis not present

## 2019-07-05 DIAGNOSIS — E1169 Type 2 diabetes mellitus with other specified complication: Secondary | ICD-10-CM | POA: Diagnosis not present

## 2019-07-05 DIAGNOSIS — Z0000 Encounter for general adult medical examination without abnormal findings: Secondary | ICD-10-CM | POA: Diagnosis not present

## 2019-07-05 DIAGNOSIS — E782 Mixed hyperlipidemia: Secondary | ICD-10-CM | POA: Diagnosis not present

## 2019-07-05 DIAGNOSIS — E1165 Type 2 diabetes mellitus with hyperglycemia: Secondary | ICD-10-CM | POA: Diagnosis not present

## 2019-07-05 DIAGNOSIS — Z125 Encounter for screening for malignant neoplasm of prostate: Secondary | ICD-10-CM | POA: Diagnosis not present

## 2019-07-05 DIAGNOSIS — E7800 Pure hypercholesterolemia, unspecified: Secondary | ICD-10-CM | POA: Diagnosis not present

## 2019-07-05 DIAGNOSIS — E1121 Type 2 diabetes mellitus with diabetic nephropathy: Secondary | ICD-10-CM | POA: Diagnosis not present

## 2019-08-07 DIAGNOSIS — D2272 Melanocytic nevi of left lower limb, including hip: Secondary | ICD-10-CM | POA: Diagnosis not present

## 2019-08-07 DIAGNOSIS — L570 Actinic keratosis: Secondary | ICD-10-CM | POA: Diagnosis not present

## 2019-08-07 DIAGNOSIS — D2271 Melanocytic nevi of right lower limb, including hip: Secondary | ICD-10-CM | POA: Diagnosis not present

## 2019-08-07 DIAGNOSIS — D2261 Melanocytic nevi of right upper limb, including shoulder: Secondary | ICD-10-CM | POA: Diagnosis not present

## 2019-08-07 DIAGNOSIS — L821 Other seborrheic keratosis: Secondary | ICD-10-CM | POA: Diagnosis not present

## 2019-08-07 DIAGNOSIS — D2262 Melanocytic nevi of left upper limb, including shoulder: Secondary | ICD-10-CM | POA: Diagnosis not present

## 2019-08-07 DIAGNOSIS — D225 Melanocytic nevi of trunk: Secondary | ICD-10-CM | POA: Diagnosis not present

## 2019-08-16 DIAGNOSIS — Z4502 Encounter for adjustment and management of automatic implantable cardiac defibrillator: Secondary | ICD-10-CM | POA: Diagnosis not present

## 2019-10-09 DIAGNOSIS — E1165 Type 2 diabetes mellitus with hyperglycemia: Secondary | ICD-10-CM | POA: Diagnosis not present

## 2019-11-15 DIAGNOSIS — I5022 Chronic systolic (congestive) heart failure: Secondary | ICD-10-CM | POA: Diagnosis not present

## 2019-11-15 DIAGNOSIS — Z4502 Encounter for adjustment and management of automatic implantable cardiac defibrillator: Secondary | ICD-10-CM | POA: Diagnosis not present

## 2019-12-17 DIAGNOSIS — E782 Mixed hyperlipidemia: Secondary | ICD-10-CM | POA: Diagnosis not present

## 2019-12-17 DIAGNOSIS — E1169 Type 2 diabetes mellitus with other specified complication: Secondary | ICD-10-CM | POA: Diagnosis not present

## 2019-12-17 DIAGNOSIS — Z125 Encounter for screening for malignant neoplasm of prostate: Secondary | ICD-10-CM | POA: Diagnosis not present

## 2019-12-24 DIAGNOSIS — Z0000 Encounter for general adult medical examination without abnormal findings: Secondary | ICD-10-CM | POA: Diagnosis not present

## 2019-12-24 DIAGNOSIS — I420 Dilated cardiomyopathy: Secondary | ICD-10-CM | POA: Diagnosis not present

## 2019-12-24 DIAGNOSIS — E782 Mixed hyperlipidemia: Secondary | ICD-10-CM | POA: Diagnosis not present

## 2019-12-24 DIAGNOSIS — E1169 Type 2 diabetes mellitus with other specified complication: Secondary | ICD-10-CM | POA: Diagnosis not present

## 2019-12-24 DIAGNOSIS — Z23 Encounter for immunization: Secondary | ICD-10-CM | POA: Diagnosis not present

## 2020-01-10 DIAGNOSIS — R5383 Other fatigue: Secondary | ICD-10-CM | POA: Diagnosis not present

## 2020-01-10 DIAGNOSIS — I5022 Chronic systolic (congestive) heart failure: Secondary | ICD-10-CM | POA: Diagnosis not present

## 2020-01-10 DIAGNOSIS — E782 Mixed hyperlipidemia: Secondary | ICD-10-CM | POA: Diagnosis not present

## 2020-01-10 DIAGNOSIS — E1169 Type 2 diabetes mellitus with other specified complication: Secondary | ICD-10-CM | POA: Diagnosis not present

## 2020-01-10 DIAGNOSIS — N182 Chronic kidney disease, stage 2 (mild): Secondary | ICD-10-CM | POA: Diagnosis not present

## 2020-01-10 DIAGNOSIS — I10 Essential (primary) hypertension: Secondary | ICD-10-CM | POA: Diagnosis not present

## 2020-01-17 DIAGNOSIS — I5022 Chronic systolic (congestive) heart failure: Secondary | ICD-10-CM | POA: Diagnosis not present

## 2020-01-17 DIAGNOSIS — I517 Cardiomegaly: Secondary | ICD-10-CM | POA: Diagnosis not present

## 2020-01-17 DIAGNOSIS — R0600 Dyspnea, unspecified: Secondary | ICD-10-CM | POA: Diagnosis not present

## 2020-02-04 DIAGNOSIS — E119 Type 2 diabetes mellitus without complications: Secondary | ICD-10-CM | POA: Diagnosis not present

## 2020-02-04 DIAGNOSIS — H353131 Nonexudative age-related macular degeneration, bilateral, early dry stage: Secondary | ICD-10-CM | POA: Diagnosis not present

## 2020-02-14 DIAGNOSIS — Z4502 Encounter for adjustment and management of automatic implantable cardiac defibrillator: Secondary | ICD-10-CM | POA: Diagnosis not present

## 2020-04-21 DIAGNOSIS — E1169 Type 2 diabetes mellitus with other specified complication: Secondary | ICD-10-CM | POA: Diagnosis not present

## 2020-04-21 DIAGNOSIS — E782 Mixed hyperlipidemia: Secondary | ICD-10-CM | POA: Diagnosis not present

## 2020-04-21 DIAGNOSIS — I420 Dilated cardiomyopathy: Secondary | ICD-10-CM | POA: Diagnosis not present

## 2020-04-28 DIAGNOSIS — E1165 Type 2 diabetes mellitus with hyperglycemia: Secondary | ICD-10-CM | POA: Diagnosis not present

## 2020-04-28 DIAGNOSIS — E1121 Type 2 diabetes mellitus with diabetic nephropathy: Secondary | ICD-10-CM | POA: Diagnosis not present

## 2020-04-28 DIAGNOSIS — E782 Mixed hyperlipidemia: Secondary | ICD-10-CM | POA: Diagnosis not present

## 2020-04-28 DIAGNOSIS — E1169 Type 2 diabetes mellitus with other specified complication: Secondary | ICD-10-CM | POA: Diagnosis not present

## 2020-04-28 DIAGNOSIS — I420 Dilated cardiomyopathy: Secondary | ICD-10-CM | POA: Diagnosis not present

## 2020-05-15 DIAGNOSIS — Z4502 Encounter for adjustment and management of automatic implantable cardiac defibrillator: Secondary | ICD-10-CM | POA: Diagnosis not present

## 2020-06-06 DIAGNOSIS — I5022 Chronic systolic (congestive) heart failure: Secondary | ICD-10-CM | POA: Diagnosis not present

## 2020-07-22 DIAGNOSIS — E1165 Type 2 diabetes mellitus with hyperglycemia: Secondary | ICD-10-CM | POA: Diagnosis not present

## 2020-07-25 DIAGNOSIS — Z95810 Presence of automatic (implantable) cardiac defibrillator: Secondary | ICD-10-CM | POA: Diagnosis not present

## 2020-07-25 DIAGNOSIS — Z7984 Long term (current) use of oral hypoglycemic drugs: Secondary | ICD-10-CM | POA: Diagnosis not present

## 2020-07-25 DIAGNOSIS — I2510 Atherosclerotic heart disease of native coronary artery without angina pectoris: Secondary | ICD-10-CM | POA: Diagnosis not present

## 2020-07-25 DIAGNOSIS — E1122 Type 2 diabetes mellitus with diabetic chronic kidney disease: Secondary | ICD-10-CM | POA: Diagnosis not present

## 2020-07-25 DIAGNOSIS — Z7982 Long term (current) use of aspirin: Secondary | ICD-10-CM | POA: Diagnosis not present

## 2020-07-25 DIAGNOSIS — I5022 Chronic systolic (congestive) heart failure: Secondary | ICD-10-CM | POA: Diagnosis not present

## 2020-07-25 DIAGNOSIS — I428 Other cardiomyopathies: Secondary | ICD-10-CM | POA: Diagnosis not present

## 2020-07-25 DIAGNOSIS — I5189 Other ill-defined heart diseases: Secondary | ICD-10-CM | POA: Diagnosis not present

## 2020-07-25 DIAGNOSIS — I340 Nonrheumatic mitral (valve) insufficiency: Secondary | ICD-10-CM | POA: Diagnosis not present

## 2020-07-25 DIAGNOSIS — I130 Hypertensive heart and chronic kidney disease with heart failure and stage 1 through stage 4 chronic kidney disease, or unspecified chronic kidney disease: Secondary | ICD-10-CM | POA: Diagnosis not present

## 2020-07-25 DIAGNOSIS — N182 Chronic kidney disease, stage 2 (mild): Secondary | ICD-10-CM | POA: Diagnosis not present

## 2020-07-29 DIAGNOSIS — E1169 Type 2 diabetes mellitus with other specified complication: Secondary | ICD-10-CM | POA: Diagnosis not present

## 2020-07-29 DIAGNOSIS — I420 Dilated cardiomyopathy: Secondary | ICD-10-CM | POA: Diagnosis not present

## 2020-07-29 DIAGNOSIS — E782 Mixed hyperlipidemia: Secondary | ICD-10-CM | POA: Diagnosis not present

## 2020-07-29 DIAGNOSIS — Z0000 Encounter for general adult medical examination without abnormal findings: Secondary | ICD-10-CM | POA: Diagnosis not present

## 2020-08-27 DIAGNOSIS — Z4502 Encounter for adjustment and management of automatic implantable cardiac defibrillator: Secondary | ICD-10-CM | POA: Diagnosis not present

## 2020-08-27 DIAGNOSIS — I5022 Chronic systolic (congestive) heart failure: Secondary | ICD-10-CM | POA: Diagnosis not present

## 2020-10-31 DIAGNOSIS — I509 Heart failure, unspecified: Secondary | ICD-10-CM | POA: Diagnosis not present

## 2020-10-31 DIAGNOSIS — I5022 Chronic systolic (congestive) heart failure: Secondary | ICD-10-CM | POA: Diagnosis not present

## 2020-10-31 DIAGNOSIS — Z95810 Presence of automatic (implantable) cardiac defibrillator: Secondary | ICD-10-CM | POA: Diagnosis not present

## 2020-10-31 DIAGNOSIS — I2510 Atherosclerotic heart disease of native coronary artery without angina pectoris: Secondary | ICD-10-CM | POA: Diagnosis not present

## 2020-10-31 DIAGNOSIS — N189 Chronic kidney disease, unspecified: Secondary | ICD-10-CM | POA: Diagnosis not present

## 2020-10-31 DIAGNOSIS — I130 Hypertensive heart and chronic kidney disease with heart failure and stage 1 through stage 4 chronic kidney disease, or unspecified chronic kidney disease: Secondary | ICD-10-CM | POA: Diagnosis not present

## 2020-10-31 DIAGNOSIS — I420 Dilated cardiomyopathy: Secondary | ICD-10-CM | POA: Diagnosis not present

## 2020-10-31 DIAGNOSIS — N182 Chronic kidney disease, stage 2 (mild): Secondary | ICD-10-CM | POA: Diagnosis not present

## 2020-10-31 DIAGNOSIS — I428 Other cardiomyopathies: Secondary | ICD-10-CM | POA: Diagnosis not present

## 2020-10-31 DIAGNOSIS — Z7984 Long term (current) use of oral hypoglycemic drugs: Secondary | ICD-10-CM | POA: Diagnosis not present

## 2020-10-31 DIAGNOSIS — Z7982 Long term (current) use of aspirin: Secondary | ICD-10-CM | POA: Diagnosis not present

## 2020-10-31 DIAGNOSIS — E1122 Type 2 diabetes mellitus with diabetic chronic kidney disease: Secondary | ICD-10-CM | POA: Diagnosis not present

## 2020-11-01 DIAGNOSIS — Z95810 Presence of automatic (implantable) cardiac defibrillator: Secondary | ICD-10-CM | POA: Diagnosis not present

## 2020-11-01 DIAGNOSIS — I420 Dilated cardiomyopathy: Secondary | ICD-10-CM | POA: Diagnosis not present

## 2020-11-27 DIAGNOSIS — Z4502 Encounter for adjustment and management of automatic implantable cardiac defibrillator: Secondary | ICD-10-CM | POA: Diagnosis not present

## 2020-12-18 DIAGNOSIS — E538 Deficiency of other specified B group vitamins: Secondary | ICD-10-CM | POA: Diagnosis not present

## 2020-12-18 DIAGNOSIS — E782 Mixed hyperlipidemia: Secondary | ICD-10-CM | POA: Diagnosis not present

## 2020-12-18 DIAGNOSIS — Z125 Encounter for screening for malignant neoplasm of prostate: Secondary | ICD-10-CM | POA: Diagnosis not present

## 2020-12-18 DIAGNOSIS — E1169 Type 2 diabetes mellitus with other specified complication: Secondary | ICD-10-CM | POA: Diagnosis not present

## 2020-12-25 DIAGNOSIS — E538 Deficiency of other specified B group vitamins: Secondary | ICD-10-CM | POA: Diagnosis not present

## 2020-12-25 DIAGNOSIS — E1169 Type 2 diabetes mellitus with other specified complication: Secondary | ICD-10-CM | POA: Diagnosis not present

## 2020-12-25 DIAGNOSIS — Z0000 Encounter for general adult medical examination without abnormal findings: Secondary | ICD-10-CM | POA: Diagnosis not present

## 2020-12-25 DIAGNOSIS — I420 Dilated cardiomyopathy: Secondary | ICD-10-CM | POA: Diagnosis not present

## 2020-12-25 DIAGNOSIS — E782 Mixed hyperlipidemia: Secondary | ICD-10-CM | POA: Diagnosis not present

## 2020-12-25 DIAGNOSIS — Z23 Encounter for immunization: Secondary | ICD-10-CM | POA: Diagnosis not present

## 2021-02-04 DIAGNOSIS — E119 Type 2 diabetes mellitus without complications: Secondary | ICD-10-CM | POA: Diagnosis not present

## 2021-02-04 DIAGNOSIS — H353131 Nonexudative age-related macular degeneration, bilateral, early dry stage: Secondary | ICD-10-CM | POA: Diagnosis not present

## 2021-05-28 DIAGNOSIS — Z45018 Encounter for adjustment and management of other part of cardiac pacemaker: Secondary | ICD-10-CM | POA: Diagnosis not present

## 2021-06-12 DIAGNOSIS — I420 Dilated cardiomyopathy: Secondary | ICD-10-CM | POA: Diagnosis not present

## 2021-06-12 DIAGNOSIS — I10 Essential (primary) hypertension: Secondary | ICD-10-CM | POA: Diagnosis not present

## 2021-06-12 DIAGNOSIS — E1121 Type 2 diabetes mellitus with diabetic nephropathy: Secondary | ICD-10-CM | POA: Diagnosis not present

## 2021-06-12 DIAGNOSIS — I5022 Chronic systolic (congestive) heart failure: Secondary | ICD-10-CM | POA: Diagnosis not present

## 2021-06-12 DIAGNOSIS — I5042 Chronic combined systolic (congestive) and diastolic (congestive) heart failure: Secondary | ICD-10-CM | POA: Diagnosis not present

## 2021-06-29 DIAGNOSIS — E538 Deficiency of other specified B group vitamins: Secondary | ICD-10-CM | POA: Diagnosis not present

## 2021-06-29 DIAGNOSIS — E782 Mixed hyperlipidemia: Secondary | ICD-10-CM | POA: Diagnosis not present

## 2021-06-29 DIAGNOSIS — E1169 Type 2 diabetes mellitus with other specified complication: Secondary | ICD-10-CM | POA: Diagnosis not present

## 2021-07-06 DIAGNOSIS — Z125 Encounter for screening for malignant neoplasm of prostate: Secondary | ICD-10-CM | POA: Diagnosis not present

## 2021-07-06 DIAGNOSIS — I420 Dilated cardiomyopathy: Secondary | ICD-10-CM | POA: Diagnosis not present

## 2021-07-06 DIAGNOSIS — E1169 Type 2 diabetes mellitus with other specified complication: Secondary | ICD-10-CM | POA: Diagnosis not present

## 2021-07-06 DIAGNOSIS — E782 Mixed hyperlipidemia: Secondary | ICD-10-CM | POA: Diagnosis not present

## 2021-07-06 DIAGNOSIS — Z0000 Encounter for general adult medical examination without abnormal findings: Secondary | ICD-10-CM | POA: Diagnosis not present

## 2021-07-06 DIAGNOSIS — E538 Deficiency of other specified B group vitamins: Secondary | ICD-10-CM | POA: Diagnosis not present

## 2021-09-30 DIAGNOSIS — I5022 Chronic systolic (congestive) heart failure: Secondary | ICD-10-CM | POA: Diagnosis not present

## 2021-09-30 DIAGNOSIS — Z4502 Encounter for adjustment and management of automatic implantable cardiac defibrillator: Secondary | ICD-10-CM | POA: Diagnosis not present

## 2021-09-30 DIAGNOSIS — I110 Hypertensive heart disease with heart failure: Secondary | ICD-10-CM | POA: Diagnosis not present

## 2021-10-16 DIAGNOSIS — I5042 Chronic combined systolic (congestive) and diastolic (congestive) heart failure: Secondary | ICD-10-CM | POA: Diagnosis not present

## 2022-03-02 DIAGNOSIS — H524 Presbyopia: Secondary | ICD-10-CM | POA: Diagnosis not present

## 2022-03-02 DIAGNOSIS — E119 Type 2 diabetes mellitus without complications: Secondary | ICD-10-CM | POA: Diagnosis not present

## 2022-03-02 DIAGNOSIS — H353131 Nonexudative age-related macular degeneration, bilateral, early dry stage: Secondary | ICD-10-CM | POA: Diagnosis not present

## 2022-03-02 DIAGNOSIS — Z961 Presence of intraocular lens: Secondary | ICD-10-CM | POA: Diagnosis not present

## 2022-03-29 DIAGNOSIS — I5042 Chronic combined systolic (congestive) and diastolic (congestive) heart failure: Secondary | ICD-10-CM | POA: Diagnosis not present

## 2022-04-28 DIAGNOSIS — Z4502 Encounter for adjustment and management of automatic implantable cardiac defibrillator: Secondary | ICD-10-CM | POA: Diagnosis not present

## 2022-04-28 DIAGNOSIS — I5042 Chronic combined systolic (congestive) and diastolic (congestive) heart failure: Secondary | ICD-10-CM | POA: Diagnosis not present

## 2022-04-28 DIAGNOSIS — Z95810 Presence of automatic (implantable) cardiac defibrillator: Secondary | ICD-10-CM | POA: Diagnosis not present

## 2022-05-31 DIAGNOSIS — I5042 Chronic combined systolic (congestive) and diastolic (congestive) heart failure: Secondary | ICD-10-CM | POA: Diagnosis not present

## 2022-06-23 DIAGNOSIS — E1169 Type 2 diabetes mellitus with other specified complication: Secondary | ICD-10-CM | POA: Diagnosis not present

## 2022-06-23 DIAGNOSIS — E538 Deficiency of other specified B group vitamins: Secondary | ICD-10-CM | POA: Diagnosis not present

## 2022-06-23 DIAGNOSIS — E782 Mixed hyperlipidemia: Secondary | ICD-10-CM | POA: Diagnosis not present

## 2022-06-30 DIAGNOSIS — E1121 Type 2 diabetes mellitus with diabetic nephropathy: Secondary | ICD-10-CM | POA: Diagnosis not present

## 2022-06-30 DIAGNOSIS — E538 Deficiency of other specified B group vitamins: Secondary | ICD-10-CM | POA: Diagnosis not present

## 2022-06-30 DIAGNOSIS — E782 Mixed hyperlipidemia: Secondary | ICD-10-CM | POA: Diagnosis not present

## 2022-06-30 DIAGNOSIS — Z125 Encounter for screening for malignant neoplasm of prostate: Secondary | ICD-10-CM | POA: Diagnosis not present

## 2022-06-30 DIAGNOSIS — I420 Dilated cardiomyopathy: Secondary | ICD-10-CM | POA: Diagnosis not present

## 2022-06-30 DIAGNOSIS — E1169 Type 2 diabetes mellitus with other specified complication: Secondary | ICD-10-CM | POA: Diagnosis not present

## 2022-07-09 DIAGNOSIS — I5022 Chronic systolic (congestive) heart failure: Secondary | ICD-10-CM | POA: Diagnosis not present

## 2022-07-09 DIAGNOSIS — E119 Type 2 diabetes mellitus without complications: Secondary | ICD-10-CM | POA: Diagnosis not present

## 2022-07-09 DIAGNOSIS — I10 Essential (primary) hypertension: Secondary | ICD-10-CM | POA: Diagnosis not present

## 2022-07-19 DIAGNOSIS — I5042 Chronic combined systolic (congestive) and diastolic (congestive) heart failure: Secondary | ICD-10-CM | POA: Diagnosis not present

## 2022-07-28 DIAGNOSIS — Z95810 Presence of automatic (implantable) cardiac defibrillator: Secondary | ICD-10-CM | POA: Diagnosis not present

## 2022-09-30 DIAGNOSIS — I5042 Chronic combined systolic (congestive) and diastolic (congestive) heart failure: Secondary | ICD-10-CM | POA: Diagnosis not present

## 2022-09-30 DIAGNOSIS — Z4502 Encounter for adjustment and management of automatic implantable cardiac defibrillator: Secondary | ICD-10-CM | POA: Diagnosis not present

## 2022-10-27 DIAGNOSIS — Z95810 Presence of automatic (implantable) cardiac defibrillator: Secondary | ICD-10-CM | POA: Diagnosis not present

## 2022-10-27 DIAGNOSIS — Z4502 Encounter for adjustment and management of automatic implantable cardiac defibrillator: Secondary | ICD-10-CM | POA: Diagnosis not present

## 2022-11-01 DIAGNOSIS — I5042 Chronic combined systolic (congestive) and diastolic (congestive) heart failure: Secondary | ICD-10-CM | POA: Diagnosis not present

## 2022-11-01 DIAGNOSIS — Z4509 Encounter for adjustment and management of other cardiac device: Secondary | ICD-10-CM | POA: Diagnosis not present

## 2022-12-02 DIAGNOSIS — I5042 Chronic combined systolic (congestive) and diastolic (congestive) heart failure: Secondary | ICD-10-CM | POA: Diagnosis not present

## 2022-12-02 DIAGNOSIS — Z4509 Encounter for adjustment and management of other cardiac device: Secondary | ICD-10-CM | POA: Diagnosis not present

## 2023-01-03 DIAGNOSIS — I5042 Chronic combined systolic (congestive) and diastolic (congestive) heart failure: Secondary | ICD-10-CM | POA: Diagnosis not present

## 2023-01-11 DIAGNOSIS — E538 Deficiency of other specified B group vitamins: Secondary | ICD-10-CM | POA: Diagnosis not present

## 2023-01-11 DIAGNOSIS — E1169 Type 2 diabetes mellitus with other specified complication: Secondary | ICD-10-CM | POA: Diagnosis not present

## 2023-01-11 DIAGNOSIS — Z125 Encounter for screening for malignant neoplasm of prostate: Secondary | ICD-10-CM | POA: Diagnosis not present

## 2023-01-11 DIAGNOSIS — E782 Mixed hyperlipidemia: Secondary | ICD-10-CM | POA: Diagnosis not present

## 2023-01-14 DIAGNOSIS — Z8546 Personal history of malignant neoplasm of prostate: Secondary | ICD-10-CM | POA: Diagnosis not present

## 2023-01-14 DIAGNOSIS — I2510 Atherosclerotic heart disease of native coronary artery without angina pectoris: Secondary | ICD-10-CM | POA: Diagnosis not present

## 2023-01-14 DIAGNOSIS — I129 Hypertensive chronic kidney disease with stage 1 through stage 4 chronic kidney disease, or unspecified chronic kidney disease: Secondary | ICD-10-CM | POA: Diagnosis not present

## 2023-01-14 DIAGNOSIS — I10 Essential (primary) hypertension: Secondary | ICD-10-CM | POA: Diagnosis not present

## 2023-01-14 DIAGNOSIS — Z7982 Long term (current) use of aspirin: Secondary | ICD-10-CM | POA: Diagnosis not present

## 2023-01-14 DIAGNOSIS — Z7985 Long-term (current) use of injectable non-insulin antidiabetic drugs: Secondary | ICD-10-CM | POA: Diagnosis not present

## 2023-01-14 DIAGNOSIS — Z95810 Presence of automatic (implantable) cardiac defibrillator: Secondary | ICD-10-CM | POA: Diagnosis not present

## 2023-01-14 DIAGNOSIS — I420 Dilated cardiomyopathy: Secondary | ICD-10-CM | POA: Diagnosis not present

## 2023-01-14 DIAGNOSIS — N182 Chronic kidney disease, stage 2 (mild): Secondary | ICD-10-CM | POA: Diagnosis not present

## 2023-01-14 DIAGNOSIS — E1122 Type 2 diabetes mellitus with diabetic chronic kidney disease: Secondary | ICD-10-CM | POA: Diagnosis not present

## 2023-01-14 DIAGNOSIS — Z7984 Long term (current) use of oral hypoglycemic drugs: Secondary | ICD-10-CM | POA: Diagnosis not present

## 2023-01-14 DIAGNOSIS — E119 Type 2 diabetes mellitus without complications: Secondary | ICD-10-CM | POA: Diagnosis not present

## 2023-01-19 DIAGNOSIS — E782 Mixed hyperlipidemia: Secondary | ICD-10-CM | POA: Diagnosis not present

## 2023-01-19 DIAGNOSIS — E1169 Type 2 diabetes mellitus with other specified complication: Secondary | ICD-10-CM | POA: Diagnosis not present

## 2023-01-19 DIAGNOSIS — Z0000 Encounter for general adult medical examination without abnormal findings: Secondary | ICD-10-CM | POA: Diagnosis not present

## 2023-01-19 DIAGNOSIS — I420 Dilated cardiomyopathy: Secondary | ICD-10-CM | POA: Diagnosis not present

## 2023-02-03 DIAGNOSIS — I5042 Chronic combined systolic (congestive) and diastolic (congestive) heart failure: Secondary | ICD-10-CM | POA: Diagnosis not present

## 2023-02-03 DIAGNOSIS — Z95811 Presence of heart assist device: Secondary | ICD-10-CM | POA: Diagnosis not present

## 2023-03-07 DIAGNOSIS — Z961 Presence of intraocular lens: Secondary | ICD-10-CM | POA: Diagnosis not present

## 2023-03-07 DIAGNOSIS — D3130 Benign neoplasm of unspecified choroid: Secondary | ICD-10-CM | POA: Diagnosis not present

## 2023-03-07 DIAGNOSIS — E119 Type 2 diabetes mellitus without complications: Secondary | ICD-10-CM | POA: Diagnosis not present

## 2023-03-07 DIAGNOSIS — H353131 Nonexudative age-related macular degeneration, bilateral, early dry stage: Secondary | ICD-10-CM | POA: Diagnosis not present

## 2023-03-07 DIAGNOSIS — Z4502 Encounter for adjustment and management of automatic implantable cardiac defibrillator: Secondary | ICD-10-CM | POA: Diagnosis not present

## 2023-03-07 DIAGNOSIS — I5042 Chronic combined systolic (congestive) and diastolic (congestive) heart failure: Secondary | ICD-10-CM | POA: Diagnosis not present

## 2023-03-10 DIAGNOSIS — Z4502 Encounter for adjustment and management of automatic implantable cardiac defibrillator: Secondary | ICD-10-CM | POA: Diagnosis not present

## 2023-03-10 DIAGNOSIS — Z95810 Presence of automatic (implantable) cardiac defibrillator: Secondary | ICD-10-CM | POA: Diagnosis not present

## 2023-04-07 DIAGNOSIS — I5042 Chronic combined systolic (congestive) and diastolic (congestive) heart failure: Secondary | ICD-10-CM | POA: Diagnosis not present

## 2023-04-07 DIAGNOSIS — Z4502 Encounter for adjustment and management of automatic implantable cardiac defibrillator: Secondary | ICD-10-CM | POA: Diagnosis not present

## 2023-05-09 DIAGNOSIS — I5042 Chronic combined systolic (congestive) and diastolic (congestive) heart failure: Secondary | ICD-10-CM | POA: Diagnosis not present

## 2023-05-09 DIAGNOSIS — Z4502 Encounter for adjustment and management of automatic implantable cardiac defibrillator: Secondary | ICD-10-CM | POA: Diagnosis not present

## 2023-06-09 DIAGNOSIS — I429 Cardiomyopathy, unspecified: Secondary | ICD-10-CM | POA: Diagnosis not present

## 2023-06-09 DIAGNOSIS — Z4502 Encounter for adjustment and management of automatic implantable cardiac defibrillator: Secondary | ICD-10-CM | POA: Diagnosis not present

## 2023-06-09 DIAGNOSIS — Z95810 Presence of automatic (implantable) cardiac defibrillator: Secondary | ICD-10-CM | POA: Diagnosis not present

## 2023-06-09 DIAGNOSIS — I5042 Chronic combined systolic (congestive) and diastolic (congestive) heart failure: Secondary | ICD-10-CM | POA: Diagnosis not present

## 2023-07-11 DIAGNOSIS — Z4502 Encounter for adjustment and management of automatic implantable cardiac defibrillator: Secondary | ICD-10-CM | POA: Diagnosis not present

## 2023-07-11 DIAGNOSIS — I5042 Chronic combined systolic (congestive) and diastolic (congestive) heart failure: Secondary | ICD-10-CM | POA: Diagnosis not present

## 2023-07-13 DIAGNOSIS — E1169 Type 2 diabetes mellitus with other specified complication: Secondary | ICD-10-CM | POA: Diagnosis not present

## 2023-07-13 DIAGNOSIS — E782 Mixed hyperlipidemia: Secondary | ICD-10-CM | POA: Diagnosis not present

## 2023-07-28 DIAGNOSIS — E1169 Type 2 diabetes mellitus with other specified complication: Secondary | ICD-10-CM | POA: Diagnosis not present

## 2023-07-28 DIAGNOSIS — I5022 Chronic systolic (congestive) heart failure: Secondary | ICD-10-CM | POA: Diagnosis not present

## 2023-07-28 DIAGNOSIS — Z125 Encounter for screening for malignant neoplasm of prostate: Secondary | ICD-10-CM | POA: Diagnosis not present

## 2023-07-28 DIAGNOSIS — I420 Dilated cardiomyopathy: Secondary | ICD-10-CM | POA: Diagnosis not present

## 2023-07-28 DIAGNOSIS — E782 Mixed hyperlipidemia: Secondary | ICD-10-CM | POA: Diagnosis not present

## 2023-09-08 DIAGNOSIS — I429 Cardiomyopathy, unspecified: Secondary | ICD-10-CM | POA: Diagnosis not present

## 2023-09-08 DIAGNOSIS — Z95810 Presence of automatic (implantable) cardiac defibrillator: Secondary | ICD-10-CM | POA: Diagnosis not present

## 2023-09-26 DIAGNOSIS — I5042 Chronic combined systolic (congestive) and diastolic (congestive) heart failure: Secondary | ICD-10-CM | POA: Diagnosis not present

## 2023-09-26 DIAGNOSIS — Z4502 Encounter for adjustment and management of automatic implantable cardiac defibrillator: Secondary | ICD-10-CM | POA: Diagnosis not present

## 2023-10-27 DIAGNOSIS — I5042 Chronic combined systolic (congestive) and diastolic (congestive) heart failure: Secondary | ICD-10-CM | POA: Diagnosis not present

## 2023-10-27 DIAGNOSIS — Z4502 Encounter for adjustment and management of automatic implantable cardiac defibrillator: Secondary | ICD-10-CM | POA: Diagnosis not present

## 2023-11-28 DIAGNOSIS — Z4502 Encounter for adjustment and management of automatic implantable cardiac defibrillator: Secondary | ICD-10-CM | POA: Diagnosis not present

## 2023-11-28 DIAGNOSIS — I5042 Chronic combined systolic (congestive) and diastolic (congestive) heart failure: Secondary | ICD-10-CM | POA: Diagnosis not present

## 2023-12-08 DIAGNOSIS — I429 Cardiomyopathy, unspecified: Secondary | ICD-10-CM | POA: Diagnosis not present

## 2023-12-08 DIAGNOSIS — Z95810 Presence of automatic (implantable) cardiac defibrillator: Secondary | ICD-10-CM | POA: Diagnosis not present

## 2024-01-16 DIAGNOSIS — Z4502 Encounter for adjustment and management of automatic implantable cardiac defibrillator: Secondary | ICD-10-CM | POA: Diagnosis not present

## 2024-01-16 DIAGNOSIS — Z7984 Long term (current) use of oral hypoglycemic drugs: Secondary | ICD-10-CM | POA: Diagnosis not present

## 2024-01-16 DIAGNOSIS — I10 Essential (primary) hypertension: Secondary | ICD-10-CM | POA: Diagnosis not present

## 2024-01-16 DIAGNOSIS — I5022 Chronic systolic (congestive) heart failure: Secondary | ICD-10-CM | POA: Diagnosis not present

## 2024-01-16 DIAGNOSIS — N182 Chronic kidney disease, stage 2 (mild): Secondary | ICD-10-CM | POA: Diagnosis not present

## 2024-01-16 DIAGNOSIS — E1122 Type 2 diabetes mellitus with diabetic chronic kidney disease: Secondary | ICD-10-CM | POA: Diagnosis not present

## 2024-01-16 DIAGNOSIS — N189 Chronic kidney disease, unspecified: Secondary | ICD-10-CM | POA: Diagnosis not present

## 2024-01-16 DIAGNOSIS — I25118 Atherosclerotic heart disease of native coronary artery with other forms of angina pectoris: Secondary | ICD-10-CM | POA: Diagnosis not present

## 2024-01-16 DIAGNOSIS — Z7985 Long-term (current) use of injectable non-insulin antidiabetic drugs: Secondary | ICD-10-CM | POA: Diagnosis not present

## 2024-01-16 DIAGNOSIS — E1169 Type 2 diabetes mellitus with other specified complication: Secondary | ICD-10-CM | POA: Diagnosis not present

## 2024-01-16 DIAGNOSIS — Z95810 Presence of automatic (implantable) cardiac defibrillator: Secondary | ICD-10-CM | POA: Diagnosis not present

## 2024-01-16 DIAGNOSIS — I5042 Chronic combined systolic (congestive) and diastolic (congestive) heart failure: Secondary | ICD-10-CM | POA: Diagnosis not present

## 2024-01-16 DIAGNOSIS — E782 Mixed hyperlipidemia: Secondary | ICD-10-CM | POA: Diagnosis not present

## 2024-01-16 DIAGNOSIS — I5020 Unspecified systolic (congestive) heart failure: Secondary | ICD-10-CM | POA: Diagnosis not present

## 2024-01-16 DIAGNOSIS — I428 Other cardiomyopathies: Secondary | ICD-10-CM | POA: Diagnosis not present

## 2024-01-16 DIAGNOSIS — Z7982 Long term (current) use of aspirin: Secondary | ICD-10-CM | POA: Diagnosis not present

## 2024-01-16 DIAGNOSIS — I130 Hypertensive heart and chronic kidney disease with heart failure and stage 1 through stage 4 chronic kidney disease, or unspecified chronic kidney disease: Secondary | ICD-10-CM | POA: Diagnosis not present

## 2024-01-16 DIAGNOSIS — Z79899 Other long term (current) drug therapy: Secondary | ICD-10-CM | POA: Diagnosis not present

## 2024-01-17 DIAGNOSIS — I5020 Unspecified systolic (congestive) heart failure: Secondary | ICD-10-CM | POA: Diagnosis not present

## 2024-01-17 DIAGNOSIS — Z4502 Encounter for adjustment and management of automatic implantable cardiac defibrillator: Secondary | ICD-10-CM | POA: Diagnosis not present

## 2024-01-23 DIAGNOSIS — I5020 Unspecified systolic (congestive) heart failure: Secondary | ICD-10-CM | POA: Diagnosis not present

## 2024-01-23 DIAGNOSIS — I3481 Nonrheumatic mitral (valve) annulus calcification: Secondary | ICD-10-CM | POA: Diagnosis not present

## 2024-01-30 DIAGNOSIS — E538 Deficiency of other specified B group vitamins: Secondary | ICD-10-CM | POA: Diagnosis not present

## 2024-01-30 DIAGNOSIS — E782 Mixed hyperlipidemia: Secondary | ICD-10-CM | POA: Diagnosis not present

## 2024-01-30 DIAGNOSIS — E1169 Type 2 diabetes mellitus with other specified complication: Secondary | ICD-10-CM | POA: Diagnosis not present

## 2024-01-30 DIAGNOSIS — Z125 Encounter for screening for malignant neoplasm of prostate: Secondary | ICD-10-CM | POA: Diagnosis not present

## 2024-02-01 DIAGNOSIS — R079 Chest pain, unspecified: Secondary | ICD-10-CM | POA: Diagnosis not present

## 2024-02-01 DIAGNOSIS — R9439 Abnormal result of other cardiovascular function study: Secondary | ICD-10-CM | POA: Diagnosis not present

## 2024-02-01 DIAGNOSIS — I5020 Unspecified systolic (congestive) heart failure: Secondary | ICD-10-CM | POA: Diagnosis not present
